# Patient Record
Sex: Male | Born: 1950 | Race: White | Hispanic: No | Smoking: Current every day smoker
Health system: Southern US, Community
[De-identification: ages and names within clinical notes are randomized; demographics above are authoritative.]

## PROBLEM LIST (undated history)

## (undated) DIAGNOSIS — I10 Essential (primary) hypertension: Secondary | ICD-10-CM

## (undated) DIAGNOSIS — E1140 Type 2 diabetes mellitus with diabetic neuropathy, unspecified: Secondary | ICD-10-CM

## (undated) DIAGNOSIS — D649 Anemia, unspecified: Secondary | ICD-10-CM

## (undated) DIAGNOSIS — I472 Ventricular tachycardia: Secondary | ICD-10-CM

## (undated) DIAGNOSIS — E119 Type 2 diabetes mellitus without complications: Secondary | ICD-10-CM

## (undated) DIAGNOSIS — I2510 Atherosclerotic heart disease of native coronary artery without angina pectoris: Secondary | ICD-10-CM

## (undated) DIAGNOSIS — I255 Ischemic cardiomyopathy: Secondary | ICD-10-CM

## (undated) DIAGNOSIS — M545 Low back pain, unspecified: Secondary | ICD-10-CM

## (undated) DIAGNOSIS — I5022 Chronic systolic (congestive) heart failure: Secondary | ICD-10-CM

## (undated) DIAGNOSIS — N529 Male erectile dysfunction, unspecified: Secondary | ICD-10-CM

## (undated) DIAGNOSIS — E7800 Pure hypercholesterolemia, unspecified: Secondary | ICD-10-CM

## (undated) DIAGNOSIS — M109 Gout, unspecified: Secondary | ICD-10-CM

## (undated) DIAGNOSIS — I4729 Other ventricular tachycardia: Secondary | ICD-10-CM

## (undated) DIAGNOSIS — I219 Acute myocardial infarction, unspecified: Secondary | ICD-10-CM

## (undated) HISTORY — PX: CARDIAC DEFIBRILLATOR PLACEMENT: SHX171

## (undated) HISTORY — DX: Male erectile dysfunction, unspecified: N52.9

## (undated) HISTORY — DX: Anemia, unspecified: D64.9

## (undated) HISTORY — DX: Essential (primary) hypertension: I10

## (undated) HISTORY — DX: Low back pain, unspecified: M54.50

## (undated) HISTORY — DX: Pure hypercholesterolemia, unspecified: E78.00

## (undated) HISTORY — DX: Gout, unspecified: M10.9

## (undated) HISTORY — DX: Acute myocardial infarction, unspecified: I21.9

## (undated) HISTORY — DX: Type 2 diabetes mellitus without complications: E11.9

## (undated) HISTORY — DX: Type 2 diabetes mellitus with diabetic neuropathy, unspecified: E11.40

## (undated) HISTORY — DX: Atherosclerotic heart disease of native coronary artery without angina pectoris: I25.10

## (undated) HISTORY — DX: Low back pain: M54.5

## (undated) HISTORY — PX: CORONARY ANGIOPLASTY WITH STENT PLACEMENT: SHX49

---

## 2003-06-24 ENCOUNTER — Inpatient Hospital Stay (HOSPITAL_COMMUNITY): Admission: AD | Admit: 2003-06-24 | Discharge: 2003-06-26 | Payer: Self-pay | Admitting: Cardiology

## 2003-06-24 ENCOUNTER — Encounter: Payer: Self-pay | Admitting: Cardiology

## 2005-05-12 ENCOUNTER — Inpatient Hospital Stay (HOSPITAL_COMMUNITY): Admission: EM | Admit: 2005-05-12 | Discharge: 2005-05-17 | Payer: Self-pay | Admitting: Emergency Medicine

## 2005-05-12 ENCOUNTER — Ambulatory Visit: Payer: Self-pay | Admitting: Internal Medicine

## 2005-05-12 DIAGNOSIS — I2510 Atherosclerotic heart disease of native coronary artery without angina pectoris: Secondary | ICD-10-CM | POA: Insufficient documentation

## 2005-05-14 ENCOUNTER — Ambulatory Visit: Payer: Self-pay | Admitting: Cardiology

## 2005-05-25 ENCOUNTER — Ambulatory Visit: Payer: Self-pay | Admitting: Cardiology

## 2005-06-21 ENCOUNTER — Encounter (HOSPITAL_COMMUNITY): Admission: RE | Admit: 2005-06-21 | Discharge: 2005-07-21 | Payer: Self-pay | Admitting: Orthopedic Surgery

## 2005-08-24 ENCOUNTER — Ambulatory Visit: Payer: Self-pay | Admitting: Cardiology

## 2006-02-23 ENCOUNTER — Ambulatory Visit: Payer: Self-pay | Admitting: Cardiology

## 2006-03-02 ENCOUNTER — Ambulatory Visit: Payer: Self-pay | Admitting: Cardiology

## 2006-03-22 ENCOUNTER — Ambulatory Visit: Payer: Self-pay | Admitting: Cardiology

## 2006-03-23 ENCOUNTER — Inpatient Hospital Stay (HOSPITAL_COMMUNITY): Admission: EM | Admit: 2006-03-23 | Discharge: 2006-03-25 | Payer: Self-pay | Admitting: Emergency Medicine

## 2006-03-23 ENCOUNTER — Ambulatory Visit: Payer: Self-pay | Admitting: Cardiology

## 2006-04-10 ENCOUNTER — Ambulatory Visit: Payer: Self-pay | Admitting: Cardiology

## 2006-04-17 ENCOUNTER — Ambulatory Visit: Payer: Self-pay

## 2006-04-27 ENCOUNTER — Ambulatory Visit: Payer: Self-pay | Admitting: Internal Medicine

## 2006-05-02 ENCOUNTER — Ambulatory Visit: Payer: Self-pay | Admitting: Cardiology

## 2006-05-12 ENCOUNTER — Ambulatory Visit: Payer: Self-pay | Admitting: Cardiology

## 2006-05-31 ENCOUNTER — Ambulatory Visit: Payer: Self-pay | Admitting: Cardiology

## 2006-06-05 ENCOUNTER — Encounter (HOSPITAL_COMMUNITY): Admission: RE | Admit: 2006-06-05 | Discharge: 2006-07-05 | Payer: Self-pay | Admitting: Internal Medicine

## 2006-06-09 ENCOUNTER — Ambulatory Visit: Payer: Self-pay

## 2006-06-14 ENCOUNTER — Ambulatory Visit: Payer: Self-pay | Admitting: Cardiology

## 2006-06-20 ENCOUNTER — Ambulatory Visit: Payer: Self-pay | Admitting: Internal Medicine

## 2006-07-07 ENCOUNTER — Encounter (HOSPITAL_COMMUNITY): Admission: RE | Admit: 2006-07-07 | Discharge: 2006-07-29 | Payer: Self-pay | Admitting: Internal Medicine

## 2006-07-26 ENCOUNTER — Ambulatory Visit: Payer: Self-pay | Admitting: Cardiology

## 2006-07-31 ENCOUNTER — Encounter (HOSPITAL_COMMUNITY): Admission: RE | Admit: 2006-07-31 | Discharge: 2006-08-30 | Payer: Self-pay | Admitting: Internal Medicine

## 2006-08-17 ENCOUNTER — Ambulatory Visit: Payer: Self-pay | Admitting: Cardiology

## 2007-06-05 ENCOUNTER — Ambulatory Visit: Payer: Self-pay | Admitting: Internal Medicine

## 2007-08-09 ENCOUNTER — Ambulatory Visit: Payer: Self-pay | Admitting: Internal Medicine

## 2008-02-13 ENCOUNTER — Ambulatory Visit (HOSPITAL_COMMUNITY)
Admission: RE | Admit: 2008-02-13 | Discharge: 2008-02-13 | Payer: Self-pay | Admitting: Physical Medicine and Rehabilitation

## 2008-02-28 ENCOUNTER — Ambulatory Visit: Payer: Self-pay | Admitting: Cardiology

## 2008-03-06 ENCOUNTER — Ambulatory Visit: Payer: Self-pay

## 2008-06-20 ENCOUNTER — Ambulatory Visit: Payer: Self-pay | Admitting: Internal Medicine

## 2008-10-13 ENCOUNTER — Encounter (HOSPITAL_COMMUNITY): Admission: RE | Admit: 2008-10-13 | Discharge: 2008-10-30 | Payer: Self-pay | Admitting: Neurology

## 2008-11-03 ENCOUNTER — Encounter (HOSPITAL_COMMUNITY): Admission: RE | Admit: 2008-11-03 | Discharge: 2008-12-03 | Payer: Self-pay | Admitting: Neurology

## 2008-11-03 DIAGNOSIS — I472 Ventricular tachycardia: Secondary | ICD-10-CM

## 2008-11-03 DIAGNOSIS — I10 Essential (primary) hypertension: Secondary | ICD-10-CM | POA: Insufficient documentation

## 2008-11-03 DIAGNOSIS — F17200 Nicotine dependence, unspecified, uncomplicated: Secondary | ICD-10-CM

## 2008-11-03 DIAGNOSIS — J449 Chronic obstructive pulmonary disease, unspecified: Secondary | ICD-10-CM

## 2008-11-03 DIAGNOSIS — F528 Other sexual dysfunction not due to a substance or known physiological condition: Secondary | ICD-10-CM | POA: Insufficient documentation

## 2008-11-03 DIAGNOSIS — J4489 Other specified chronic obstructive pulmonary disease: Secondary | ICD-10-CM | POA: Insufficient documentation

## 2008-11-03 DIAGNOSIS — E7800 Pure hypercholesterolemia, unspecified: Secondary | ICD-10-CM

## 2008-11-03 DIAGNOSIS — D539 Nutritional anemia, unspecified: Secondary | ICD-10-CM | POA: Insufficient documentation

## 2008-11-03 DIAGNOSIS — I255 Ischemic cardiomyopathy: Secondary | ICD-10-CM

## 2008-11-03 DIAGNOSIS — E119 Type 2 diabetes mellitus without complications: Secondary | ICD-10-CM | POA: Insufficient documentation

## 2008-12-03 ENCOUNTER — Encounter: Payer: Self-pay | Admitting: Internal Medicine

## 2009-02-26 ENCOUNTER — Ambulatory Visit: Payer: Self-pay | Admitting: Cardiology

## 2009-02-26 DIAGNOSIS — R072 Precordial pain: Secondary | ICD-10-CM | POA: Insufficient documentation

## 2009-06-30 ENCOUNTER — Encounter (INDEPENDENT_AMBULATORY_CARE_PROVIDER_SITE_OTHER): Payer: Self-pay | Admitting: *Deleted

## 2009-07-30 ENCOUNTER — Encounter (INDEPENDENT_AMBULATORY_CARE_PROVIDER_SITE_OTHER): Payer: Self-pay | Admitting: *Deleted

## 2009-09-14 ENCOUNTER — Encounter: Payer: Self-pay | Admitting: Cardiology

## 2009-10-07 ENCOUNTER — Encounter: Payer: Self-pay | Admitting: Physician Assistant

## 2009-10-07 ENCOUNTER — Ambulatory Visit: Payer: Self-pay | Admitting: Cardiology

## 2009-10-07 DIAGNOSIS — R079 Chest pain, unspecified: Secondary | ICD-10-CM

## 2009-10-13 ENCOUNTER — Telehealth (INDEPENDENT_AMBULATORY_CARE_PROVIDER_SITE_OTHER): Payer: Self-pay

## 2009-10-14 ENCOUNTER — Ambulatory Visit: Payer: Self-pay

## 2009-10-14 ENCOUNTER — Ambulatory Visit: Payer: Self-pay | Admitting: Internal Medicine

## 2009-10-14 ENCOUNTER — Encounter (HOSPITAL_COMMUNITY): Admission: RE | Admit: 2009-10-14 | Discharge: 2009-10-29 | Payer: Self-pay | Admitting: Cardiology

## 2009-10-14 ENCOUNTER — Encounter: Payer: Self-pay | Admitting: Cardiology

## 2009-10-22 ENCOUNTER — Ambulatory Visit: Payer: Self-pay | Admitting: Cardiology

## 2009-12-23 ENCOUNTER — Telehealth: Payer: Self-pay | Admitting: Cardiology

## 2010-01-12 ENCOUNTER — Ambulatory Visit: Payer: Self-pay | Admitting: Internal Medicine

## 2010-01-12 DIAGNOSIS — Z95810 Presence of automatic (implantable) cardiac defibrillator: Secondary | ICD-10-CM

## 2010-10-06 ENCOUNTER — Encounter: Payer: Self-pay | Admitting: Cardiology

## 2010-10-26 ENCOUNTER — Ambulatory Visit: Payer: Self-pay | Admitting: Cardiology

## 2010-10-26 ENCOUNTER — Inpatient Hospital Stay (HOSPITAL_COMMUNITY)
Admission: RE | Admit: 2010-10-26 | Discharge: 2010-10-27 | Payer: Self-pay | Source: Home / Self Care | Attending: Physician Assistant | Admitting: Physician Assistant

## 2010-11-21 ENCOUNTER — Encounter: Payer: Self-pay | Admitting: Cardiology

## 2010-11-30 NOTE — Letter (Signed)
Summary: [facility]  [facility]   Imported By: Marylou Mccoy 11/02/2009 13:38:01  _____________________________________________________________________  External Attachment:    Type:   Image     Comment:   External Document

## 2010-11-30 NOTE — Assessment & Plan Note (Signed)
Summary: pc2/ appt is 4:00 p.m.  gd   Visit Type:  Follow-up Primary Provider:  Fara Chute, MD   History of Present Illness: [name] [name] returns today for followup.  He is a very pleasant middle-aged male with an ischemic cardiomyopathy, ventricular tachycardia, congestive heart failure, diabetes, and hypertension with ongoing tobacco abuse.  He returns today for followup. He complains of fatigue and weakness.  He continues to work a regular week job. He also c/o discomfort at his ICD site when he drives. No ICD shocks.  Current Medications (verified): 1)  Zetia 10 Mg Tabs (Ezetimibe) .... Take One Tablet By Mouth Daily. 2)  Carvedilol 25 Mg Tabs (Carvedilol) .... Take Two Tablets By Mouth Twice A Day 3)  Ramipril 10 Mg Caps (Ramipril) .... Take One Capsule By Mouth Daily 4)  Hydrochlorothiazide 25 Mg Tabs (Hydrochlorothiazide) .... Take One Tablet By Mouth Daily. 5)  Spiriva Handihaler 18 Mcg Caps (Tiotropium Bromide Monohydrate) .... Daily 6)  Advair Diskus 100-50 Mcg/dose Misc (Fluticasone-Salmeterol) .... Daily 7)  Actos 45 Mg Tabs (Pioglitazone Hcl) .Marland Kitchen.. 1 Tab Daily 8)  Omeprazole 20 Mg Tbec (Omeprazole) .... Daily 9)  Aspirin 81 Mg Tbec (Aspirin) .... Take One Tablet By Mouth Daily 10)  Lantus Solostar 100 Unit/ml Soln (Insulin Glargine) .... 30 Units 11)  Ferrous Sulfate 325 (65 Fe) Mg Tbec (Ferrous Sulfate) .... 2 Two Times A Day 12)  Neurontin 300 Mg Caps (Gabapentin) .Marland Kitchen.. 1 Tab Two Times A Day 13)  Percocet 10-325 Mg Tabs (Oxycodone-Acetaminophen) .... Prn 14)  Phenergan 25 Mg/ml Soln (Promethazine Hcl) .... As Needed 15)  Nitroglycerin 0.4 Mg Subl (Nitroglycerin) .... One Tablet Under Tongue Every 5 Minutes As Needed For Chest Pain---May Repeat Times Three 16)  Glipizide 10 Mg Tabs (Glipizide) .Marland Kitchen.. 1 Once Daily 17)  Metformin Hcl 500 Mg Tabs (Metformin Hcl) .... Take Two Times A Day 18)  Morphine Sulfate 15 Mg Tabs (Morphine Sulfate) .... Every 12 Hrs 19)  Valium 5 Mg Tabs  (Diazepam) .... Take Two At Bedtime 20)  Amlodipine Besylate 5 Mg Tabs (Amlodipine Besylate) .... 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    Vital Signs:  Patient profile:   60 year old male Height:      69 inches Weight:      209 pounds BMI:     30.98 Pulse rate:   66 / minute BP sitting:   140 / 80  Vitals Entered By: Laurance Flatten CMA (January 12, 2010 10:55 AM) Comments Pt does not have a medication list. Nor does he know what medications he is on. Pt states that his meds have not changed since he  saw Dr. Lance Morin a month ago.   Physical Exam  General:  Well-developed well-nourished in no acute distress.  Skin is warm and dry.  HEENT is normal.  Neck is supple. No thyromegaly.  Chest is clear to auscultation with normal expansion.  Well healed ICD incision. Cardiovascular exam is regular rate and rhythm.  Abdominal exam  nontender or distended. No masses palpated. Extremities show no edema. neuro grossly intact     ICD Specifications Following MD:  Lewayne Bunting, MD     Referring MD:  CRENSHAW ICD Vendor:  Medtronic     ICD Model Number:  7232     ICD Serial Number:  [id number] H ICD DOI:  03/24/2006     ICD Implanting MD:  Lewayne Bunting, MD  Lead 1:    Location: RV     DOI: 03/24/2006     Model #: 5621     Serial #: [id number]     Status:  active  Indications::  ICM   ICD Follow Up Remote Check?  No Battery Voltage:  3.10 V     Charge Time:  8.40 seconds     Underlying rhythm:  SR ICD Dependent:  No       ICD Device Measurements Right Ventricle:  Amplitude: 4.5 mV, Impedance: 496 ohms, Threshold: 2.0 V at 0.9 msec Shock Impedance: 55/74 ohms   Episodes Shock:  0     ATP:  0     Nonsustained:  0     Ventricular Pacing:  <0.1%  Brady Parameters Mode VVI     Lower Rate Limit:  40      Tachy Zones VF:  200     VT:  273     VT1:  171     Next Cardiology Appt Due:  03/31/2010 Tech Comments:  Normal device function. No changes made today.  [phone] lead stable.  Pt has updated magnet and letter.  ROV 3 months clinic. Gypsy Balsam RN BSN  January 12, 2010 10:55 AM  MD Comments:  Agree with above.  Impression & Recommendations:  Problem # 1:  VENTRICULAR TACHYCARDIA (ICD-427.1) No recent ICD therapies.  Will follow and continue current meds. His updated medication list for this problem includes:    Carvedilol 25 Mg Tabs (Carvedilol) .Marland Kitchen... Take two tablets by mouth twice a day    Ramipril 10 Mg Caps (Ramipril) .Marland Kitchen... Take one capsule by mouth daily    Aspirin 81 Mg Tbec (Aspirin) .Marland Kitchen... Take one tablet by mouth daily    Nitroglycerin 0.4 Mg Subl (Nitroglycerin) ..... One tablet under tongue every 5 minutes as needed for chest pain---may repeat times three    Amlodipine Besylate 5 Mg Tabs (Amlodipine besylate) .Marland Kitchen... Take one tablet by mouth daily  Problem # 2:  CARDIOMYOPATHY, ISCHEMIC (ICD-414.8) He denies anginal symptoms. Continue current meds. His updated medication list for this problem includes:    Carvedilol 25 Mg Tabs (Carvedilol) .Marland Kitchen... Take two tablets by mouth twice a day    Ramipril 10 Mg Caps (Ramipril) .Marland Kitchen... Take one capsule by mouth daily    Hydrochlorothiazide 25 Mg Tabs (Hydrochlorothiazide) .Marland Kitchen... Take one tablet by mouth daily.    Aspirin 81 Mg Tbec (Aspirin) .Marland Kitchen... Take one tablet by mouth daily    Nitroglycerin  0.4 Mg Subl (Nitroglycerin) ..... One tablet under tongue every 5 minutes as needed for chest pain---may repeat times three    Amlodipine Besylate 5 Mg Tabs (Amlodipine besylate) .Marland Kitchen... Take one tablet by mouth daily  Problem # 3:  AUTOMATIC IMPLANTABLE CARDIAC DEFIBRILLATOR SITU (ICD-V45.02) His device is working normally.  Will recheck in several months.

## 2010-11-30 NOTE — Progress Notes (Signed)
Summary: pt needs 90day supply  Phone Note Refill Request Call back at Home Phone [phone] Message from:  [facility] [address] [facility] [phone]  Refills Requested: Medication #1:  AMLODIPINE BESYLATE 5 MG TABS Take one tablet by mouth daily. pt needs a 90day supply Rx written instead of 30day  Initial call taken by: Omer Jack,  December 23, 2009 1:33 PM    Prescriptions: RAMIPRIL 10 MG CAPS (RAMIPRIL) Take one capsule by mouth daily  #90 x 3   Entered by:   Kem Parkinson   Authorized by:   Ferman Hamming, MD, [facility]   Signed by:   Kem Parkinson on 12/23/2009   Method used:   [facility] to        [facility] [address]       [address] [place]  [address]       Ph: [phone]       Fax: [phone]   RxID:   [id number]

## 2010-12-02 NOTE — Letter (Signed)
Summary: [facility] Office Visit Note   [facility] Office Visit Note   Imported By: Roderic Ovens 10/26/2010 16:06:25  _____________________________________________________________________  External Attachment:    Type:   Image     Comment:   External Document

## 2011-01-10 LAB — DIFFERENTIAL
Basophils Absolute: 0 10*3/uL (ref 0.0–0.1)
Basophils Relative: 0 % (ref 0–1)
Eosinophils Absolute: 0.2 10*3/uL (ref 0.0–0.7)
Neutrophils Relative %: 59 % (ref 43–77)

## 2011-01-10 LAB — COMPREHENSIVE METABOLIC PANEL
ALT: 26 U/L (ref 0–53)
Alkaline Phosphatase: 51 U/L (ref 39–117)
CO2: 28 mEq/L (ref 19–32)
GFR calc non Af Amer: >60 mL/min (ref >60)
Glucose, Bld: 78 mg/dL (ref 70–99)
Potassium: 4.6 mEq/L (ref 3.5–5.1)
Sodium: 140 mEq/L (ref 135–145)
Total Bilirubin: 0.3 mg/dL (ref 0.3–1.2)

## 2011-01-10 LAB — CBC
HCT: 38.7 % — ABNORMAL LOW (ref 39.0–52.0)
HCT: 42.5 % (ref 39.0–52.0)
Hemoglobin: 14 g/dL (ref 13.0–17.0)
MCHC: 32.3 g/dL (ref 30.0–36.0)
MCV: 86.4 fL (ref 78.0–100.0)
Platelets: 197 10*3/uL (ref 150–400)
RDW: 13.4 % (ref 11.5–15.5)
WBC: 7.2 10*3/uL (ref 4.0–10.5)
WBC: 8.1 10*3/uL (ref 4.0–10.5)

## 2011-01-10 LAB — URINALYSIS, ROUTINE W REFLEX MICROSCOPIC
Nitrite: NEGATIVE
Specific Gravity, Urine: 1.014 (ref 1.005–1.030)
Urobilinogen, UA: 0.2 mg/dL (ref 0.0–1.0)
pH: 5 (ref 5.0–8.0)

## 2011-01-10 LAB — PROTIME-INR: INR: 0.96 (ref 0.00–1.49)

## 2011-01-10 LAB — GLUCOSE, CAPILLARY
Glucose-Capillary: 100 mg/dL — ABNORMAL HIGH (ref 70–99)
Glucose-Capillary: 124 mg/dL — ABNORMAL HIGH (ref 70–99)
Glucose-Capillary: 134 mg/dL — ABNORMAL HIGH (ref 70–99)
Glucose-Capillary: 155 mg/dL — ABNORMAL HIGH (ref 70–99)
Glucose-Capillary: 162 mg/dL — ABNORMAL HIGH (ref 70–99)
Glucose-Capillary: 190 mg/dL — ABNORMAL HIGH (ref 70–99)
Glucose-Capillary: 87 mg/dL (ref 70–99)

## 2011-01-10 LAB — BASIC METABOLIC PANEL
BUN: 8 mg/dL (ref 6–23)
Calcium: 8.9 mg/dL (ref 8.4–10.5)
GFR calc non Af Amer: >60 mL/min (ref >60)
Glucose, Bld: 127 mg/dL — ABNORMAL HIGH (ref 70–99)
Potassium: 4.3 mEq/L (ref 3.5–5.1)
Sodium: 137 mEq/L (ref 135–145)

## 2011-01-10 LAB — CROSSMATCH: Antibody Screen: NEGATIVE

## 2011-01-10 LAB — SURGICAL PCR SCREEN: MRSA, PCR: NEGATIVE

## 2011-01-10 LAB — APTT: aPTT: 31 seconds (ref 24–37)

## 2011-01-20 ENCOUNTER — Encounter (INDEPENDENT_AMBULATORY_CARE_PROVIDER_SITE_OTHER): Payer: Self-pay | Admitting: *Deleted

## 2011-01-27 NOTE — Letter (Signed)
Summary: Appointment - Reminder 2  [facility], Main Office  [address]   [place], [place] [address]   Phone: [phone]  Fax: [phone]     January 20, 2011 MRN: [id number]   [name] [name] [address] [place], [place]  [address]   Dear [name],  Our records indicate that it is past time to schedule a follow-up appointment.  Dr. Ladona Ridgel reccomended that you follow up with [place] in March. It is very important that we reach you to schedule this appointment. We look forward to participating in your health care needs. Please contact us at the number listed above at your earliest convenience to schedule your appointment.  If you are unable to make an appointment at this time, give [place] a call so we can update our records.     Sincerely,   Glass blower/designer

## 2011-03-15 NOTE — Assessment & Plan Note (Signed)
[facility]                         ELECTROPHYSIOLOGY OFFICE NOTE   NAME:[name], [name]                      MRN:          [id number]  DATE:06/20/2008                            DOB:          [birth year]    HISTORY OF PRESENT ILLNESS:  [name] [name] returns today for followup.  He  is a very pleasant middle-aged male with an ischemic cardiomyopathy,  ventricular tachycardia, congestive heart failure, diabetes, and  hypertension with ongoing tobacco abuse.  He returns today for followup.  He complains of fatigue and weakness.  He continues to work a regular  week job.   MEDICATIONS:  1. Advicor 100/50 DSC b.i.d.  2. Actos 45 a day.  3. Omeprazole 20 a day.  4. Aspirin 81 a day.  5. Insulin as directed.  6. Zetia 10 a day.  7. Altace 10 a day.  8. Coreg 25 twice daily.  9. Glucotrol 10 twice daily.  10.Plavix 75 a day.  11.HCTZ 25 a day.   PHYSICAL EXAMINATION:  GENERAL:  He is a pleasant, middle-aged obese,  mildly obese man, in no acute distress.  VITAL SIGNS:  Blood pressure was 123/72, pulse 56 and regular,  respirations 18, weight was 213 pounds.  NECK:  Revealed no jugular distention.  LUNGS:  Clear bilaterally to auscultation.  No wheezes, rales, or  rhonchi are present.  CARDIOVASCULAR:  Revealed a regular rate and rhythm.  Normal S1 and S2.  EXTREMITIES:  Demonstrate no edema.  ABDOMEN:  Obese, nontender, nondistended.  There is no organomegaly.   Interrogation of his defibrillator demonstrates a Medtronic Maximo, R  waves were 4, the impedance 400, the threshold 3.5 at 0.4.  Battery  voltage was 3.16 volts.   IMPRESSION:  1. Ischemic cardiomyopathy.  2. Congestive heart failure.  3. Ventricular tachycardia.  4. Status post implantable cardioverter-defibrillator insertion.  5. Ongoing tobacco use.   DISCUSSION:  [name] [name] is stable.  His defibrillator is working  satisfactorily.  There is no evidence that he has a broken 6949  lead.  I  have encouraged him to continue his working as  well as he can to stop smoking.  Continue his medicines to maintain a  low-salt diet.  We will see him back in 1-year for ICD followup.     [name] [name]. Ladona Ridgel, MD  Electronically Signed    GWT/MedQ  DD: 06/20/2008  DT: 06/21/2008  Job #: [id number]

## 2011-03-15 NOTE — Assessment & Plan Note (Signed)
[facility]                         ELECTROPHYSIOLOGY OFFICE NOTE   NAME:[name] [name], [name] [name]                      MRN:          [id number]  DATE:06/05/2007                            DOB:          13-Oct-1951    Mr. Minkoff returns today for followup. He is a very pleasant, middle-  aged man with an ischemic cardiomyopathy, nonsustained ventricular  tachycardia status post ICD insertion. He has multiple stents implanted.  He returns today for followup. He notes that when he exerts himself  sometimes he will have substernal chest tightness which was better with  rest. It has not progressed in any significant amount, however.  Otherwise, he had no specific complaints today.   On physical exam, he is a pleasant, well-appearing, young man in no  acute distress. The blood pressure was 137/78, pulse 69 and regular, the  respirations were 18, the weight was 203 pounds.  The neck revealed no jugular venous distention.  LUNGS:  Were clear bilaterally to auscultation. No wheezes, rales or  rhonchi were present.  CARDIOVASCULAR EXAM:  Revealed a regular rate and rhythm with a normal  S1 and S2.  EXTREMITIES:  No edema.   MEDICATIONS:  Include:  1. Zetia 10 mg daily.  2. Aciphex 20 a day.  3. Coreg 25 twice a day.  4. Altace 10 a day.  5. Glucotrol 10 b.i.d.  6. Plavix 75 a day.  7. Actos 30 a day.  8. Aspirin 325 a day.  9. Multivitamins.   Interrogation of his defibrillator demonstrates a Medtronic Maximo with  R waves of 3.3. The impedance was 440 ohms with a threshold of 3.5 volts  at 0.3 milliseconds. Battery voltage was 3.19 volts. Today, we turned  his outputs up to 4 to help backup the pace. There were no short  countered RR intervals noted.   IMPRESSION:  1. Ischemic cardiomyopathy.  2. Congestive heart failure.  3. Status post ICD insertion.   DISCUSSION:  Overall, Mr. Hijazi is stable. He is questioned whether now  two years out from his  stents he needs to continue on his Plavix. I will  discuss this with Dr. Riley Kill. For now, he will continue with all of his  current medications.     Doylene Canning. Ladona Ridgel, MD  Electronically Signed    GWT/MedQ  DD: 06/05/2007  DT: 06/05/2007  Job #: [id number]   cc:   Fara Chute

## 2011-03-15 NOTE — Assessment & Plan Note (Signed)
[facility]                            CARDIOLOGY OFFICE NOTE   NAME:[name] [name], ABDALLA NARAMORE                      MRN:          [id number]  DATE:02/28/2008                            DOB:          02-09-1951    [name] is a 60 year old gentleman who has a history of coronary  artery disease, ICD secondary to ventricular tachycardia and mildly  reduced LV function.  Since I last saw him, he continues to have  occasional chest pain; this predominantly occurs exertion.  It has been  a chronic issue and is unchanged in severity or frequency.  He also  continues to have fatigue, which is unchanged.  There is no dyspnea or  pedal edema.  He is discussing having injections in his back for back  pain and they would like to discontinue his Plavix and aspirin.   MEDICATIONS:  1. Advair.  2. Actos.  3. Omeprazole.  4. Zetia 10 mg p.o. daily.  5. Coreg 25 mg p.o. b.i.d.  6. Altace 10 mg p.o. daily.  7. Glucotrol 10 mg p.o. b.i.d.  8. Plavix 75 mg p.o. daily.  9. Aspirin 325 mg p.o. daily.  10.Iron.  11.Hydrochlorothiazide 25 mg p.o. daily.  12.Spiriva.   PHYSICAL EXAMINATION:  His physical exam today shows a blood pressure of  152/75 and his pulse is 56.  He weighs 207 pounds.  HEENT:  Normal.  NECK:  Supple with no bruits.  CHEST:  Clear.  CARDIOVASCULAR:  Regular rhythm.  ABDOMEN:  Exam shows no tenderness and there is no bruit.  EXTREMITIES:  Show no edema.   ELECTROCARDIOGRAM:  Shows a sinus rhythm at a rate of 57.  There are no  ST changes noted.   DIAGNOSES:  1. Coronary artery disease with history of drug-eluting stents to the      right coronary artery and left anterior descending -- [name] [name]      was having some vague chest pain.  He has had problems with this in      the past and it is unchanged.  His last catheterization in May 2007      showed nonobstructive disease.  We will schedule him for an      adenosine Myoview.  If it shows no  ischemia, then we will continue      with medical therapy.  We will continue his aspirin, Plavix, Coreg,      ACE inhibitor and Zetia.  He has not tolerated statins in the past.  2. Chest pain -- as per #1.  3. History of mildly reduced left ventricular function.  4. History of ventricular cardia, status post implantable cardioverter-      defibrillator -- he will follow up Dr. Ladona Ridgel concerning this      issue.  5. Hypertension -- he will continue on his present blood pressure      medications.  6. Hyperlipidemia -- he will continue on Zetia and we will have his      most recent lipids forwarded to [place] with Dr. Dian Situ office.  I      will  also have his most recent BMET forwarded to [place] as well.  He      has not tolerated statins in the past.  7. Diabetes mellitus -- management per Dr. Neita Carp.  8. Chronic obstructive pulmonary disease.  9. History of microcytic anemia -- this has been evaluated by Dr.      Neita Carp previously.  10.Tobacco abuse -- we discussed the importance of discontinuing this.  11.Statin intolerance.   I will see him back in 9 months if his Myoview looks unchanged.  Note:  We discussed the injection of steroids into his back.  I think it would  be reasonable for him to discontinue his Plavix prior to this procedure,  but I would like for him to continue on a baby aspirin if possible; if  not, we need to resume these immediately after the procedure.  He can  decrease his aspirin to 81 mg long-term.     Madolyn Frieze Jens Som, MD, [facility]  Electronically Signed    BSC/MedQ  DD: 02/28/2008  DT: 02/28/2008  Job #: [id number]   cc:   Fara Chute

## 2011-03-18 NOTE — H&P (Signed)
NAMEBURNELL, HURTA NO.:  [id number]   MEDICAL RECORD NO.:  [id number]          PATIENT TYPE:  INP   LOCATION:  1826                         FACILITY:  [facility]   PHYSICIAN:  Rollene Rotunda, M.D.   DATE OF BIRTH:  08-31-1951   DATE OF ADMISSION:  05/12/2005  DATE OF DISCHARGE:                                HISTORY & PHYSICAL   PRIMARY CARE PHYSICIAN:  Dr. Marcelino Duster   CARDIOLOGIST:  Dr. Simona Huh   CHIEF COMPLAINT:  Chest pain.   HISTORY OF PRESENT ILLNESS:  The patient is a 60 year old gentleman with a  previous catheterization with non-obstructive coronary disease in the past  (June 25, 2003 50% LAD stenosis, 50% PDA stenosis.  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  He had well-preserved ejection  fraction).  He has been managed with risk reduction, but has not  participated aggressively.  He has continued to smoke two to three packs of  cigarettes a day.  He also has history of diabetes and hypertension.   He has been having chest pain on and off for the last couple of days.  This  has been substernal and at rest.  This morning at 10:30 at home getting  ready to shower he developed substernal chest discomfort 10/10 with  diaphoresis and radiation down his left arm.  He called EMS squad.  They  arrived some time around 11:22 and noted 2 mm ST segment elevation in leads  2, 3, aVF with 1 mm ST segment depression 1 and aVL.  He was managed with  aspirin and transported directly to [facility] where he arrived  around noon.  He was also managed with heparin, one dose of morphine, and a  sublingual nitroglycerin.  We were called urgently to the emergency room  where he was assessed and taken directly to the catheterization laboratory.   PAST MEDICAL HISTORY:  1.  Diabetes mellitus (it is not clear whether he is on an oral agent or      not).  2.  Hypertension.  3.  Dyslipidemia.  4.  COPD.  5.  Erectile  dysfunction.  6.  Plantar fasciitis.   PAST SURGICAL HISTORY:  None.   ALLERGIES:  None (the patient has been intolerant of LIPITOR and LESCOL in  the past).   MEDICATIONS:  1.  Aspirin 81 mg daily.  2.  Albuterol.  3.  Altace 10 mg q.12h.  4.  Elavil 25 mg q.h.s.  5.  Aciphex 20 mg a day.  6.  Zetia 10 mg daily.  7.  Coreg 25 mg q.12h.   SOCIAL HISTORY:  The patient is married.  Smoking two to three packs of  cigarettes per day and has done so for 40 years.  He does not drink alcohol.  He works in a [employer] development [employer].  He has been under stress at home.   FAMILY HISTORY:  Contributory for his father dying in his 37s of a heart  problem.   REVIEW OF SYSTEMS:  As stated in the HPI (obtained per wife).  Decreased  hearing, nasal discharge, cough and wheezing, fatigue, leg cramps, erectile  dysfunction, gastroesophageal reflux.  Otherwise, negative for all other  systems.   PHYSICAL EXAMINATION:  GENERAL:  The patient is in obvious distress with  pain.  VITAL SIGNS:  Blood pressure 148/76, heart rate 58 and regular.  HEENT:  Eyelids unremarkable.  Pupils are equal, round, and reactive to  light.  Fundi not visualized.  Oral mucosa not visualized.  NECK:  No bruits.  No obvious thyromegaly.  No jugular veins assessed as he  is laying flat.  LYMPHATICS:  No obvious cervical, axillary, or inguinal adenopathy.  LUNGS:  Decreased breath sounds, but no wheezing or crackles.  BACK:  No obvious costovertebral angle tenderness.  CHEST:  Unremarkable.  HEART:  PMI not displaced or sustained.  S1, S2 within normal limits.  No  S3.  No S4.  No rubs.  ABDOMEN:  Flat.  Positive bowel sounds.  Normal in frequency and pitch.  No  bruits, rebound, guarding, midline pulsatile mass, hepatomegaly,  splenomegaly.  SKIN:  No rashes.  No nodules.  EXTREMITIES:  2+ pulses upper, 2+ femorals, 1+ dorsalis pedis and posterior  tibialis bilaterally.  No clubbing, cyanosis, edema.  NEUROLOGIC:  The  patient is oriented to person, place, and time.  Cranial  nerves are grossly intact.  Motor is grossly intact throughout.   EKG as above.   LABORATORIES:  Sodium 131, potassium 6.9, chloride 107, BUN 14.  Hemoglobin  15.3.  Glucose 383.  pH 7.28.   ASSESSMENT/PLAN:  1.  Coronary disease.  Patient is having an acute inferior myocardial      infarction.  He is taken urgently to the catheterization laboratory for      intervention per Dr. Riley Kill.  2.  Diabetes.  The patient appears to have low level of potentially      ketoacidosis.  This will need to be managed aggressively with      Glucommander.  3.  Dyslipidemia.  Will try to start him on Pravachol.  Will try to assess      his true intolerance of Statins after he has completed his urgent      intervention.  Will check a fasting lipid profile.  4.  Tobacco.  Will get a smoking consult.       JH/MEDQ  D:  05/12/2005  T:  05/12/2005  Job:  [id number]   cc:   Marcelino Duster  [address] [place]  [place] [address]  Fax: [phone]   Jonelle Sidle, M.D. [facility]

## 2011-03-18 NOTE — Discharge Summary (Signed)
NAME:  [name] [name], [name] [name]                         ACCOUNT NO.:  [id number]   MEDICAL RECORD NO.:  [id number]                   PATIENT TYPE:  INP   LOCATION:  3728                                 FACILITY:  [facility]   PHYSICIAN:  Learta Codding, M.D.                 DATE OF BIRTH:  Sep 06, 1951   DATE OF ADMISSION:  06/24/2003  DATE OF DISCHARGE:  06/26/2003                           DISCHARGE SUMMARY - REFERRING   PROCEDURE:  Coronary angiogram on June 25, 2003.   REASON FOR ADMISSION:  Mr. Hyle is a 60 year old male with no prior  history of coronary artery disease, but multiple cardiac risk factors,  recently referred to Dr. Lewayne Bunting and referred for elective coronary  angiography.  Please refer to dictated admission note for full details.   LABORATORY DATA:  Normal CBC.  Sodium 137, potassium 4.4, glucose 161, BUN  17, creatinine 1.3. INR 0.8.   Admission chest x-ray:  Mild bronchitic changes.   HOSPITAL COURSE:  The patient presented for elective coronary angiography,  performed by Dr. Vida Roller (see report for full details), revealing  noncritical coronary artery disease with a normal left ventricle.  Specifically, there was a 50% mid LAD disease with a 75% mid distal  anomalous circumflex (off RCA) with normal right coronary artery.  LV gram  revealed hyperdynamic LVF (70%), with no wall motion abnormalities.  There  was significant valvular abnormalities.  Distal aortogram was normal.   Dr. [name] recommended medical therapy and the patient was started on low  dose Toprol and Lipitor.  He will continue on Altace as previously directed.  Dr. [name] also recommended a followup exercise stress Cardiolite next week  to assess functional capacity.  We will perform this on low dose beta-  blocker.  The patient will also need a fasting lipid/liver profile prior to  his stress test.   DISCHARGE MEDICATIONS:  1. Coated aspirin 81 mg daily.  2. Altace 5 mg daily.  3.  Glucophage 500 mg t.i.d.  4. Toprol XL 25 mg daily.  5. Lipitor 20 mg daily.  6. Nitrostat 0.4 mg p.r.n.   ACTIVITY:  1. The patient is to refrain from returning to work until July 07, 2003.  2. Stop smoking tobacco.   DIET:  The patient is to maintain a low fat/cholesterol diet.   WOUND CARE:  Call the office if there is any swelling/bleeding of the groin.   FOLLOW UP:  1. The patient is instructed to have a fasting lipid/liver profile on     Monday, July 31, 2003, at 8:30 a.m. at the [facility].  2. The patient is scheduled for an exercise stress Cardiolite on Tuesday,     July 01, 2003, at 7:30 a.m. at [facility]  3. The patient will then follow up with Dr. Lewayne Bunting on Thursday,     July 03, 2003, at 11 a.m. at our clinic  in [place].   DISCHARGE DIAGNOSES:  1. Noncritical coronary artery disease.     a. Coronary angiogram on June 25, 2003.     b. Medical therapy recommended.  2. Multiple cardiac risk factors.     a. History of hypertension.     b. Dyslipidemia.     c. Type 2 diabetes mellitus.     d. Tobacco.     e. Family history.      Rande Brunt, P.A.-C  [facility]             Learta Codding, M.D.    ECS/MEDQ  D:  06/26/2003  T:  06/26/2003  Job:  [id number]   cc:   Clarene Critchley., M.D., [place], [place]

## 2011-03-18 NOTE — Assessment & Plan Note (Signed)
[facility]                              CARDIOLOGY OFFICE NOTE   NAME:[name] [name], [name] [name]                      MRN:          [id number]  DATE:05/31/2006                            DOB:          04-24-1951    [name] [name] is a 60 year old gentleman with a history of coronary disease who  has been previously followed by Dr. [name] and Dr. Graciela Husbands.  I do not have any  records available concerning his [place] evaluations.  He apparently had an  inferior myocardial infarction in July of 2006.  He had PCI of his right  coronary artery at that time and staged PCI of his LAD subsequently.  These  were with drug-eluding stents.  He was recently admitted to [facility] after he complained of presyncopal episodes, and then monitor  showed ventricular tachycardia.  The patient subsequently underwent cardiac  catheterization.  He was found to have a 30% left main, 30% LAD, and the  stents in the previous right coronary artery were patent as well.  There was  a 50-60% distal PLA/circumflex, not changed from previous cath, and his EF  was 45%.  It was felt that he was well to be revascularized.  The patient  subsequently had an ICD placed.  The patient apparently has been seeing Dr.  [name] since that time.  He does have some dyspnea on exertion and fatigue.  There is no orthopnea, PND or pedal edema.  He has also had continued  episodes of dizziness.  Dr. [name] apparently ordered an echocardiogram,  pulmonary function test and other studies, but I do not have those records  available.  The patient also saw Dr. Graciela Husbands recently in followup.  In that  note, apparently, there was a Myoview in April 2007 that apparently was  negative.  Dr. Graciela Husbands performed an exercise treadmill and his initial blood  pressure was 170/50 and at 1 minute was 143/56.  At 2 minutes, he was  165/62, and at 3 minutes, he was 131/64.  Finally, at 4 minutes, it was  135/57.  It was felt that he  was having dizzy spells with blood pressure  instability with exercise and possibly cough syncope.  There was no evidence  of ventricular arrhythmias.  Dr. [name] recently decreased his Coreg.  The  patient has not had chest pain or syncope.   MEDICATIONS:  Aspirin 325 mg p.o. daily, Plavix 75 mg p.o. daily, Altace 10  mg p.o. daily, AcipHex 20 mg p.o. daily, Glucotrol 10 mg p.o. b.i.d., Zetia  10 mg p.o. daily, Actos 30 mg p.o. daily, Cymbalta, Coreg 12.5 mg p.o.  b.i.d., and hydrochlorothiazide 25 mg p.o. daily.  He also takes Metanx.   PHYSICAL EXAMINATION:  VITAL SIGNS:  Blood pressure 128/80.  Pulse 67.  He  weighs 211 pounds.  NECK:  Supple.  CHEST:  Clear.  CARDIOVASCULAR:  Regular rhythm.  EXTREMITIES:  Show no edema.   DIAGNOSES:  1.  Coronary artery disease with prior drug-eluding stents to the right      coronary artery and left anterior descending (LAD).  2.  History of mildly reduced left ventricular function.  3.  History of ventricular tachycardia, status post implantable cardioverter-      defibrillator (ICD).  4.  Hypertension.  5.  Hyperlipidemia.  6.  Diabetes mellitus.  7.  Chronic obstructive pulmonary disease.  8.  Tobacco abuse.  9.  Microcytic anemia.  10. History of intolerance to Lipitor and Lescol.   PLAN:  [name] [name] is complaining of weakness and fatigue.  He also has  dyspnea.  I am at somewhat of a disadvantage as most of his records are in  [place], and we will obtain his clinic notes and also his echocardiogram,  pulmonary function tests, and other laboratories.  He is complaining of some  leg pain with ambulation and we will schedule him to have ABIs as well.  He  will also have lipids and liver, as well as a TSH and a BNP.  His blood  pressure appears to be reasonable today and I will not adjust his medicines  at this point.  I will see him back in two weeks to review the above  information.                              Madolyn Frieze Jens Som, MD,  [facility]    BSC/MedQ  DD:  05/31/2006  DT:  05/31/2006  Job #:  [id number]

## 2011-03-18 NOTE — Op Note (Signed)
[name] [name], [name] [name] NO.:  [id number]   MEDICAL RECORD NO.:  [id number]          PATIENT TYPE:  INP   LOCATION:  3739                         FACILITY:  [facility]   PHYSICIAN:  Doylene Canning. Ladona Ridgel, M.D.  DATE OF BIRTH:  [birth year]   DATE OF PROCEDURE:  03/24/2006  DATE OF DISCHARGE:                                 OPERATIVE REPORT   PROCEDURE PERFORMED:  Implantation of single chamber ICD.   INDICATION:  Ischemic heart disease, status post MI with LV dysfunction and  VT at a rate of 280 beats per minute.   The patient is a 60 year old man who had a history of recurrent syncope  spells and near syncope spells, who has a prior MI with diaphragmatic  infarction.  His EF is 45% with a posterobasal area of akinesis.  The  patient was admitted to hospital after he was found to have rapid wide QRS  tachycardia at 280 beats per minute.  Because this was associated with  symptoms and was documented for over 30 seconds, he is referred for ICD  implantation.   PROCEDURE:  After informed consent was obtained, the patient was taken  diagnostic EP lab in a fasting state.  After preparation and draping,  intravenous fentanyl and metaxalone were given for sedation.  30 mL  lidocaine was infiltrated into the left infraclavicular region.  A 7 cm  incision was carried out over this region.  Electrocautery utilized dissect  down to the fascial plane.  10 mL of contrast was injected into the left  upper extremity venous system, demonstrating a patent left subclavian vein.  Subsequently punctured and the Medtronic model [id number], 65-cm active fixation  defibrillation lead, serial number [id number], was advanced into the right  ventricle.  Mapping was carried out and in the final site on the RV septum  the R-waves measured 10 mV.  The pacing impedance with lead actively fixed  was 683 ohms, and the pacing threshold 0.6 volts at 0.5 milliseconds.  Next,  10 volt pacing was carried out,  demonstrating no diaphragmatic stimulation.  At this point, the lead was secured to the subpectoralis fascia with a  figure-of-eight silk suture.  Sewing sleeve was also secured with silk  suture.  Electrocautery was utilized to make subcutaneous pocket.  Kanamycin  irrigation was utilized to irrigate the pocket and electrocautery utilized  to assure hemostasis.  The Medtronic Maximo, model [id number], single chamber  defibrillator, serial number [id number], was connected to the defibrillation  lead and placed in the subcutaneous pocket.  Generator was secured with silk  suture.  Electrocautery was utilized to assure hemostasis.  Kanamycin  irrigation was utilized to irrigate the pocket and the incision then closed  with a layer of 2-0 Vicryl, followed by layer of 3-0 Vicryl, followed by  layer of 4-0 Vicryl.  Benzoin was painted on skin, Steri-Strips were  applied, pressure dressing was placed, and the patient was returned to his  room in satisfactory condition.   COMPLICATIONS:  There were no immediate procedure complications.  Results  demonstrate successful implantation of a Medtronic single chamber  defibrillator in a patient with ischemic cardiomyopathy and sustained  monomorphic VT.           ______________________________  Doylene Canning. Ladona Ridgel, M.D.     GWT/MEDQ  D:  03/24/2006  T:  03/26/2006  Job:  [id number]   cc:   Olga Millers, M.D. [facility]  [address]  [address]  [place]  [place] [address]   Jonelle Sidle, M.D. [facility]  [address]  [place]  [place] [address]   Learta Codding, M.D. [facility]  [address]  [address]  [place]  [place] [address]

## 2011-03-18 NOTE — H&P (Signed)
[name] [name], LIST NO.:  [id number]   MEDICAL RECORD NO.:  [id number]          PATIENT TYPE:  INP   LOCATION:  1850                         FACILITY:  [facility]   PHYSICIAN:  Olga Millers, M.D. LHCDATE OF BIRTH:  August 02, 1951   DATE OF ADMISSION:  03/23/2006  DATE OF DISCHARGE:                                HISTORY & PHYSICAL   PRIMARY CARDIOLOGIST:  Learta Codding, M.D.   PRIMARY CARE PHYSICIAN:  Fara Chute, M.D. in [place].   PATIENT PROFILE:  A 60 year old married white male with history of CAD who  presents with 8-week history of presyncope with documented ventricular  tachycardia on event monitor this morning.   PROBLEM LIST:  1.  Presyncope/VT.  2.  Coronary artery disease.      1.  On 05/12/2005 an inferior ST elevation MI with catheterization          revealing separate ostia of the LAD and circumflex.  The LAD had 60%          proximal; and then 8% mid with diffuse disease distally.  The          circumflex was small and diffusely diseased.  The RCA was 100%          proximally occluded.  The RCA was treated with a 3.5 x 12 mm Taxus          drug-eluting stent x2.      2.  On 05/16/2005 PCI and stenting of the mid LAD with a 2.5 x 12 mm          Taxus drug-eluting stent.      3.  In April 2007 Myoview study reportedly negative by patient.  3.  Hypertension.  4.  Hyperlipidemia.  5.  Type 2 diabetes mellitus.  6.  COPD.  7.  Tobacco abuse.  8.  Erectile dysfunction.  9.  Ischemic cardiomyopathy with EF of 45% in July 2006 with inferobasal      wall motion abnormalities noted on catheterization.   HISTORY OF PRESENT ILLNESS:  A 60 year old married white male with history  of CAD status post inferior MI with drug-eluting stents to the RCA (2) and  LAD (1) in July 2006.  Approximately 8 weeks ago he began to experience  episodes of presyncope (approximately 10 episodes over the past 2 months) in  the setting of either:  1) A 3/10 exertional  substernal chest pressure  associated with shortness of breath, diaphoresis, nausea, and flushing;  lasting less than 1 minute, and resolving with rest; or 2)  Fits of  coughing.  The patient was recently treated with antibiotics over the past 2  weeks for a diagnosis of pneumonia; and has been coughing quite frequently  with right-sided pleuritic-type chest pain while coughing.  He saw [name] [name] and [name] [name] in [place] on April 26 following an episode of presyncope  that occurred while he was in his garden.  At that time, he had a functional  study which he was told was normal.  Those records are not available at this  time.  He then was set up with an event monitor, which he picked up  yesterday.  He thinks sometime this morning he had episode of coughing; and  began to feel lightheaded; and, therefore, activated the event monitor.  On  analysis it was noted that he had significant runs of wide complex  ventricular tachycardia.  The patient was then contacted; and was advised to  present to the [facility] ED for further evaluation.  Currently he is pain  free.  He has not had any arrhythmias on the monitor.  He denies any PND,  orthopnea, true syncope, edema, or early satiety.   ALLERGIES:  He has been intolerant to Lipitor and Lescol in the past.   HOME MEDICATIONS:  1.  Aspirin 325 mg daily.  2.  Plavix 75 mg daily.  3.  Coreg 25 mg b.i.d.  4.  Altace 10 mg daily.  5.  Aciphex 20 mg daily.  6.  Glucotrol XL 10 mg b.i.d.  7.  Zetia 10 mg daily.  8.  Actos 30 mg daily.  9.  Cymbalta 60 mg daily.  10. Albuterol p.r.n.  11. Nitroglycerin p.r.n.  12. Imdur unknown dose daily.   FAMILY HISTORY:  Mother is age 30 alive and well.  Father died in his 12s of  CAD.  He has 1 brother with diabetes.   SOCIAL HISTORY:  [place].  He works  at a [employer].  He is married.  He has smoked for approximately 40  years; anywhere between 2 and 3  packs a day, currently smoking 1 pack a day.  Denies any alcohol or drugs.  He is active around the house, although has  been limited some in the past 2 months as outlined in the HPI.   REVIEW OF SYSTEMS:  Positive for chest pain, shortness breath, dyspnea  exertion, presyncope, cough, right-sided costochondritis, type 2 diabetes  mellitus.  All other systems reviewed are negative.   PHYSICAL EXAM:  VITAL SIGNS:  Temperature 98.1, heart rate 67, respirations  18, blood pressure 149/66 and pulse oximetry 98% on 2 liters/min.  GENERAL:  A pleasant white male in no acute distress.  Awake, alert, and  oriented x3.  NECK:  Normal __________, no bruits or JVD.  LUNGS:  Respirations are regular and labored with scattered rhonchi and  expiratory wheezing.  CARDIAC:  Regular S1-S2, no S3-S4, or murmurs.  ABDOMEN:  Round, soft, nontender, nondistended.  Bowel sounds present x4.  EXTREMITIES:  Warm and dry, pink.  No clubbing, cyanosis, or edema.  Dorsalis pedis and posterior tibial pulses are 1+ and equal bilaterally.  No  femoral bruits noted.   CHEST X-RAY:  Pending.   EKG:  Shows sinus rhythm with a rate of 62.  No acute ST-T changes.   LABS:  All of his lab work is pending.   ASSESSMENT AND PLAN:  1.  Presyncope/ventricular tachycardia.  The patient clearly has symptomatic      VT with presyncope and now documented on event monitor.  He has      previously documented normal LV function, and recent negative functional      study.  Notably the patient reports that he had a negative functional      study prior to MI in 2006 as well.  Plan to admit and cycle cardiac      markers.  Check CBC, BMET, magnesium, and thyroid function.  We will put  him on the schedule for cardiac catheterization, tomorrow, to further      rule out ischemia as the cause of VT.  We will obtain an LV-gram at time      to determine LV function.  We will also have him seen by     electrophysiology.  We have  already discussed this case with Dr. Graciela Husbands.      Patient will more than likely require an EP study and possibly an ICD      provided there are no obvious targets that might be causing ischemia on      catheterization.  Continue his beta blocker, ACE inhibitor, aspirin,      Plavix and Zetia.  He has tried Lipitor and Lescol in the past and      apparently did not tolerate these.  We will try a low dose of Zocor and      see how he does with that.  2.  CAD, as above, we will obtain cardiac catheterization.  The patient has      been having exertional chest discomfort despite negative functional      study.  Continue meds as above.  3.  Ischemic cardiomyopathy, EF previously documented at 45%.  To be      reevaluated with catheterization.  He appears euvolemic on exam.      Continue beta blocker and ACE inhibitor.  4.  Hypertension, slightly elevated currently.  We will continue to trend,      and otherwise continue beta blocker and Altace both of which are more or      less maxed out.  Could consider adding Norvasc.  5.  Hyperlipidemia.  Will add Zocor as above.  Continue Zetia.  6.  Type 2 diabetes mellitus.  Continue his home regimen and add sliding      scale insulin.  7.  Chronic obstructive pulmonary disease.  He does have a faint wheeze.  We      will have him taken his albuterol q.i.d. plus p.r.n. rather than p.r.n.      only.  8.  Ongoing tobacco abuse.  Smoking cessation strongly advised.  The patient      says that this is his only crutch.  We will have him seen by the smoking      cessation consult team.      Ok Anis, NP    ______________________________  Olga Millers, M.D. [facility]    CRB/MEDQ  D:  03/23/2006  T:  03/23/2006  Job:  [id number]

## 2011-03-18 NOTE — Cardiovascular Report (Signed)
NAMEDENIS, CARREON NO.:  [id number]   MEDICAL RECORD NO.:  [id number]           PATIENT TYPE:   LOCATION:                                 FACILITY:   PHYSICIAN:  Arturo Morton. Riley Kill, M.D. [facility] DATE OF BIRTH:   DATE OF PROCEDURE:  05/12/2005  DATE OF DISCHARGE:                              CARDIAC CATHETERIZATION   INDICATIONS:  The patient is a 60 year old gentleman who presents with an  acute inferior wall myocardial infarction.   PROCEDURE:  1.  Left heart catheterization.  2.  Selective coronary arteriography.  3.  Selective left ventriculography.  4.  Insertion of a temporary transvenous pacer in the RV apex.  5.  Percutaneous stenting of the right coronary artery using Taxus Express      stents.   DESCRIPTION OF PROCEDURE:  The patient was seen in the emergency room by Dr.  Antoine Poche.  The patient had DKA and potassium 6.7.  Sodium bicarbonate was  given.  A Glucomander was started.  Temporary transvenous pacer insertion  was performed from the right femoral vein.  Coronary arteriography and left  ventriculography was performed in standard fashion.  Following this,  percutaneous coronary intervention was performed.  Heparin and Integrilin  were given according to protocol and ACT checked and found to be  appropriate.  A JR-4 side-hole Wise guide was utilized with the 7-French  device.  High-torque floppy straight wire was used followed by a Luge.  Initial dilatations were done with Maverick #2 balloon.  The proximal total  occlusion was stented with a 12 x 3.5 Taxus drug-eluting stent and the  distal lesion and with a 12 x 3.5 Taxus drug-eluting stent. The vessels were  post dilated using a Quantum Maverick #4 balloon.  There were no major  complications with the procedure.   HEMODYNAMIC DATA:  1.  Central aortic pressure 137/80.  2.  Left ventricular pressure 137/25.  3.  No gradient pullback across aortic valve.   ANGIOGRAPHIC DATA:  1.  There  appeared to be separate ostia of the LAD and circumflex.  The LAD      demonstrates about a 60% segmental plaque in the proximal portion of the      vessel.  It does not appear to be flow-limiting.  After the takeoff of      the diagonal branch, the LAD is a severely and diffusely diseased      vessel.  There are septal collaterals to the distal right coronary      circulation.  2.  The circumflex is a small vessel that provides predominantly one      marginal branch.  It is severely and diffusely diseased.  It is very      small in caliber.  3.  The right coronary artery is totally occluded in the proximal portion of      the mid vessel.  4.  Ventriculography was performed in the RAO projection.  Estimated      ejection fraction is in the range of about 45%.  There is a large  segmental wall motion abnormality involving the inferobasal segment.   Following balloon intervention, the right coronary artery demonstrated TIMI  III flow.  There is severe disease proximally with a fair amount of diffuse  spasm. There is a segmental lesion noted distally that was eccentric and  slightly hazy. This was reduced from 70% down to 0% with a Taxus drug-  eluting stent. The more proximal lesion was reduced from 100-90 to 0%, also  using a Taxus drug-eluting stent.  Following balloon dilatation, both  lesions were reduced to 0% residual luminal narrowing. The PDA had some mild  proximal irregularity.  There is a large posterolateral segment with  excellent TIMI III flow in multiple posterolateral branches which all have a  fair amount of luminal irregularity.   CONCLUSION:  1.  Acute inferior wall myocardial infarction due to total occlusion of the      right coronary with successful reperfusion therapy and associated wall      motion abnormality.  2.  Moderately high grade stenosis of the proximal left anterior descending      artery with diffuse disease of the distal LAD.  3.  Small  insignificant diffusely irregular circumflex coronary arteries as      described above.   DISPOSITION:  The patient will likely need restudy on Friday.  This will be  set up per Dr. Samule Ohm.      Arturo Morton. Riley Kill, M.D. [facility]  Electronically Signed     TDS/MEDQ  D:  03/16/2006  T:  03/16/2006  Job:  [id number]

## 2011-03-18 NOTE — Assessment & Plan Note (Signed)
[facility]                              CARDIOLOGY OFFICE NOTE   NAME:Trautner, VONN SLIGER                      MRN:          [id number]  DATE:08/17/2006                            DOB:          1951-08-26    Mr. Aird returns for followup today.  He has a history of coronary  disease, ICD secondary to ventricular tachycardia, and mildly reduced LV  function.  Since I last saw him, we did proceed with a CTA of his lower  extremities due to leg pain.  He was found to have mild bilateral SFA and  proximal popliteal disease of doubtful hemodynamic significance.  There was  disease but contiguous three vessel runoff on the left and two vessel runoff  on the right.  There was also note of diverticulosis.  We also asked Mr.  Kirker to follow up with Dr. Neita Carp concerning his thyroid functions,  anemia, and pulmonary disease.  He has done this, and he has told me that  his thyroid tests were repeated and were unremarkable, and he has now had a  colonoscopy that was also unremarkable.  He continues to have complaints of  dyspnea on exertion, fatigue, occasional chest pain, and hip and lower  extremity pain predominantly in the knees.  His symptoms were unchanged from  previous.   His medications at present include Zetia 10 mg p.o. daily, __________1  daily, AcipHex 20 mg p.o. daily, Coreg 25 mg p.o. b.i.d., Altace 10 mg p.o.  daily, Glucotrol XL 10 mg p.o. b.i.d., Cymbalta 60 mg p.o. daily, Plavix 75  mg p.o. daily, hydrochlorothiazide 25 mg p.o. daily, Actos 30 mg p.o. daily,  aspirin 325 mg p.o. daily, vitamin B complex and albuterol as needed.   PHYSICAL EXAMINATION:  VITAL SIGNS:  Blood pressure 149/74, and his pulse is  76.  He weighs 205 pounds.  NECK:  Supple with no bruits.  CHEST:  Clear.  CARDIOVASCULAR:  Regular rate and rhythm.  ABDOMEN:  No pulsatile masses.  No bruits.  EXTREMITIES:  No edema.  He has 2+ posterior tibial pulses  bilaterally.   DIAGNOSES:  1. Coronary artery disease with history of drug-eluting stents to the      right coronary artery and left anterior descending artery.  2. History of mildly reduced left ventricular function.  3. History of ventricular tachycardia, status post implantable      cardioverter/defibrillator.  4. Hypertension.  5. Hyperlipidemia.  6. Diabetes mellitus.  7. Chronic obstructive pulmonary disease.  8. History of microcytic anemia.  9. Tobacco abuse.  10.History of intolerance to statins.   PLAN:  Mr. Girouard continues to have multiple complaints.  He has had chest  pain, but we did perform a cardiac catheterization on Mar 24, 2006.  He was  found to have a 30% left main and a 30% mid LAD.  The distal stent was  widely patent.  There was really no circumflex artery.  The right coronary  artery was large, and the proximal distal stents were widely patent.  The  PDA had a 30-40% ostial lesion.  There  were 50-60% stenosis in the  posterolateral.  His ejection fraction was 45%.  Also note he had a previous  nuclear study in [place], interpreted by Dr. [name] on Mar 02, 2006 that showed  no ischemia and a prior inferior infarct.  Also note, the previous  echocardiogram showed an ejection fraction of 40% with inferior hypokinesis.  When I last saw him, we did check a BNP that was normal at 32.  I therefore  feel that we have evaluated Mr. Suder extensively, and there is mildly  reduced LV function with the prior inferior infarct but no other  abnormalities are noted.  He also now has an ICD in place.  We will continue  with the Zetia, and as noted above, he has not tolerated his statins.  He  will continue on his aspirin and Plavix as well as his Coreg and Altace.  He  has asked again about disability.  I do not think that he is disabled from a  cardiac standpoint, and I have stated that he can return to work on August 23, 2006.  If he has other medical issues that cause  him to be disabled,  then I have asked him to discuss this with Dr. Neita Carp.  I have also asked  him to follow up with Dr. Neita Carp concerning his history of microcytic  anemia, mild thyroid abnormality, and probable COPD.  We will check a BNP  today to follow his renal function.  We will see him back in six months.            ______________________________  Madolyn Frieze. Jens Som, MD, [facility]     BSC/MedQ  DD:  08/17/2006  DT:  08/18/2006  Job #:  [id number]   cc:   Fara Chute

## 2011-03-18 NOTE — Assessment & Plan Note (Signed)
[facility]                              CARDIOLOGY OFFICE NOTE   NAME:[name] [name], [name] [name]                      MRN:          [id number]  DATE:06/14/2006                            DOB:          May 17, 1951    [name] [name] returns for followup today.  Please refer to my note of May 31, 2006 for details.  Since that time we have been able to obtain some records  from [place].  The patient did have pulmonary function tests performed that  showed a moderate obstructive defect and there is severe decrease in  diffusing capacity suggesting emphysema.  He also had an echocardiogram  performed at [facility] on May 12, 2006.  This is interpreted by Dr. [name] and  showed the ejection fraction of 40% with inferior hypokinesis and mild  mitral and tricuspid regurgitation.  A CT of his head in July showed no  acute intracranial abnormality.  He had laboratories drawn in [place] in July  as well that showed a decreased TSH but the T3 uptake, thyroxine and free-  thyroxine index were normal.  He also has had a microcytic anemia.  The  patient also had a nuclear study performed on Mar 02, 2006 that showed no  significant ischemia, but there was a prior inferior infarct.  His cath  results were outlined in my previous note.  Also note the patient had lower  extremity Dopplers that showed a mid right SFA stenosis over 4 cm greater  than 50% and a mid to distal left SFA stenosis of greater than 50%.  A CTA  was suggested.  Since I saw him previously, [name] [name] continues to have  significant complaints.  He is complaining of fatigue as well as shortness  of breath with activities.  He is also complaining of bilateral low  extremity pain with ambulation.  He also states that he occasionally has  chest tightness when he exerts himself.  However, he has had that for some  time by his report.   CURRENT MEDICATIONS:  1. Plavix 75 mg p.o. q. day.  2. Aspirin 325 mg p.o. q. day.  3.  Coreg 25 mg p.o. b.i.d.  4. Altace 10 mg p.o. q. day.  5. AcipHex.  6. Glucotrol XL 10 mg p.o. b.i.d.  7. Zetia 10 mg p.o. q. day..  8. Nitroglycerin patch 0.2 mg q. day.  9. Actos.  10.Cymbalta.   PHYSICAL EXAMINATION:  VITAL SIGNS:  Blood pressure 126/71. Pulse is 69.  He  weighs 209 pounds.  NECK:  Supple and I cannot appreciate bruits.  CHEST:  Clear.  CARDIOVASCULAR:  Reveals a regular rate and rhythm.  ABDOMEN:  Shows no pulsatile masses, no bruits.  EXTREMITIES:  Show no edema.   An electrocardiogram today shows a normal sinus rhythm at a rate of 69.  The  axis is normal.  There are no ST changes noted.   DIAGNOSES:  1. Coronary artery disease with history of drug-eluting stents to the      right coronary artery and left anterior descending.  2. History of mildly  reduced left ventricular function.  3. History of ventricular tachycardia status post implantable cardioverter-      defibrillator.  4. Hypertension.  5. Hyperlipidemia.  6. Diabetes mellitus.  7. Chronic obstructive pulmonary disease.  8. History of microcytic anemia.  9. Tobacco abuse.  10.History of intolerance to statins.   PLAN:  [name] [name] continues to have multiple complaints.  He has had some  mild chest tightness with more extreme activities but his catheterization in  May revealed non-obstructive coronary disease and his nuclear study was also  performed at that time and showed infarct and no ischemia.  I think we  should continue with medical therapy.  He has also complained of bilateral  lower extremity pain with ambulation and has had normal Dopplers.  We will  schedule him for a CTA to more fully evaluate and he may need a PB consult.  He also is complaining of dyspnea.  I will check a BNP today.  However,  certainly he could be having a contribution to his dyspnea from pulmonary  disease and I have stressed the importance of discontinuing his tobacco use.  I have asked him to follow up with  Dr. Neita Carp for possible therapy for  emphysema and also he needs full evaluation of microcytic anemia.  I have  instructed him that his drug-eluting stents were placed greater than one-  year ago.  If he requires to come off of antiplatelet agents for any  procedure such as colonoscopy or endoscopy, then he could discontinue his  Plavix for a short amount of time but continue his aspirin.  He would then  resume the Plavix immediately following his procedures.  I have also  instructed him that there is some risk with that given his drug-eluting  stents and the risk of thrombosis but this should be diminished given that  these were placed over a year ago.  I have also discussed the patient with  Dr. [name] today.  He has been persistent about his demands for disability  and some of his symptoms seem out-of-proportion to what we have found with  our cardiac testing.  We will continue to follow this.  I told him he could  stay out of work for now.  I will see him back in 8 weeks to review the  above information.  Note we will also repeat his TSH as it was low  previously.  I will check a T3 and T4 as well.  We will continue on with his  cholesterol medicine with a goal LDL of less than 70.                              Madolyn Frieze Jens Som, MD, [facility]    BSC/MedQ  DD:  06/14/2006  DT:  06/14/2006  Job #:  [id number]   cc:   Fara Chute

## 2011-03-18 NOTE — Cardiovascular Report (Signed)
NAMEAHMARION, SARACENO NO.:  [id number]   MEDICAL RECORD NO.:  [id number]          PATIENT TYPE:  INP   LOCATION:  6532                         FACILITY:  [facility]   PHYSICIAN:  Salvadore Farber, M.D. LHCDATE OF BIRTH:  09/08/1951   DATE OF PROCEDURE:  05/16/2005  DATE OF DISCHARGE:                              CARDIAC CATHETERIZATION   PROCEDURE:  Left heart catheterization, left ventriculography, coronary  angiography, drug-eluting stent placement in the mid left anterior  descending.   INDICATIONS:  Mr. Milosevic is a 60 year old gentleman who presented on July 13  with inferior myocardial infarction.  He underwent placement of two drug-  eluting stents in the RCA by Dr. Riley Kill.  Post infarct course has been  uncomplicated.   On Dr. Rosalyn Charters initial diagnostic angiogram the LAD appeared to be  severely and diffusely diseased.  However, the picture was not inconsistent  with spasm.  At that time his blood pressure precluded use of intracoronary  nitroglycerin to assess this question.  He therefore returns to the  laboratory today for planned repeat diagnostic angiography, in particular to  assess the LAD.  Patient was also consented for possible percutaneous  coronary intervention.   PROCEDURAL TECHNIQUE:  Informed consent was obtained.  Under 1% lidocaine  local anesthesia a 5-French sheath was placed in the right common femoral  artery using the modified Seldinger technique.  Diagnostic angiography and  ventriculography were performed using JL4, JR4, and pigtail catheters.  Prior to the imaging of the left system 200 mcg of intracoronary  nitroglycerin was administered.   Images demonstrated that the LAD is diffusely diseased, much of what was  seen previously was spasm.  He did have a focal 80% stenosis of the mid  vessel.  Decision was made to proceed to percutaneous revascularization of  this.   Sheath was up sized over a wire to 6-French.   Anticoagulation was initiated  with bivalirudin.  The patient was maintained on his Plavix.  A 6-French  CLS3.5 guide was advanced over a wire and engaged in the ostium of the left  main.  Prowater wire was advanced to the distal LAD without difficulty.  The  lesion was directly stented using a 2.5 x 12 mm TAXUS at 16 atmospheres.  The mid portion of the stent was then post dilated using a 2.5 x 8 mm  Quantum at 18 atmospheres.  Final angiography demonstrated no residual  stenosis and TIMI 3 flow to the distal vasculature.  The patient tolerated  the procedure well and was transferred to the holding room in stable  condition.   COMPLICATIONS:  None.   FINDINGS:  1.  LV 155/19/24.  EF 64% with inferior akinesis.  2.  No aortic stenosis or mitral regurgitation.  3.  Left main:  30% stenosis of the distal vessel.  4.  LAD:  Moderate sized vessel giving rise to two moderate sized diagonal      branches.  After the administration of intracoronary nitroglycerin it      was clear that much of what was seen last week was coronary spasm.  However, the vessel distal to the first diagonal is clearly diffusely      diseased.  There was an 80% focal stenosis in the mid vessel      successfully stented with drug-eluting stent.  The vessel distal to the      second diagonal was approximately 2 mm in diameter.  There was a 30-40%      stenosis within this portion of the vessel.  5.  Circumflex:  Small vessel which is angiographically normal.  6.  RCA:  Very large, dominant vessel giving rise to a PDA and a very large      posterolateral left ventricular branch.  The previously placed stent in      the proximal and mid vessel are widely patent.  There are serial 80%      stenoses within the posterior left ventricular branch.   IMPRESSION AND PLAN:  The right coronary artery stents are widely patent.  The spasm between the stents is resolved.  The left anterior descending  spasm is resolved and a  focal mid left anterior descending lesion was  treated successfully with drug-eluting stent.  Will plan on conservative  management of the residual disease in his right coronary artery territory as  the more proximal of the two lesions involves a large bifurcation and the  viability of this territory is questionable, at best.       WED/MEDQ  D:  05/16/2005  T:  05/16/2005  Job:  [id number]   cc:   Berlinda Last, M.D.   Learta Codding, M.D. [facility]  [address]  [address]  [place]  [place] [address]

## 2011-03-18 NOTE — Cardiovascular Report (Signed)
NAMEYORDAN, [name] [name] NO.:  [id number]   MEDICAL RECORD NO.:  [id number]          PATIENT TYPE:  INP   LOCATION:  2925                         FACILITY:  [facility]   PHYSICIAN:  Charlton Haws, M.D.     DATE OF BIRTH:  Oct 13, 1951   DATE OF PROCEDURE:  03/24/2006  DATE OF DISCHARGE:                              CARDIAC CATHETERIZATION   PROCEDURE:  Arteriography.   INDICATIONS:  Increasing VT, history coronary disease.   Cine catheterization was done from the right femoral artery using 6-French  catheters.  At the end of the case, hand injection of the right SFA and  iliac showed [place] to be in good position for use of Angio-Seal.  The device  was delivered with good hemostasis.   Left main coronary artery had a 30% distal stenosis.   Left anterior descending artery had 30% midvessel disease.  The distal stent  was widely patent.  The distal LAD was small with diffuse disease.  The  first, second and third diagonal branches had no critical disease.   There was really no circumflex artery.   The right coronary artery was large.  The proximal and distal stents were  widely patent.  The PDA had 30-40% ostial disease.  The distal PLA, which  supplied the circumflex territory, had 50-60% multiple discrete lesions,  which is unchanged from previous.   RAO ventriculography:  RAO ventriculography showed inferobasal wall  hypokinesis.  EF was in the 45% range.  There was no gradient across the  aortic valve and no MR.  Aortic pressure was 142/72, LV pressure was 143/17.   IMPRESSION:  The patient does not have critical coronary disease.  His  stents were widely patent.  He will have a follow-up EP study.  A note  should be made that he also has guaiac-positive stools and will need a GI  evaluation.   The patient tolerated the procedure well.           ______________________________  Charlton Haws, M.D.     PN/MEDQ  D:  03/24/2006  T:  03/24/2006  Job:  [id number]

## 2011-03-18 NOTE — Cardiovascular Report (Signed)
[name] [name], [name] [name] NO.:  [id number]   MEDICAL RECORD NO.:  [id number]                   PATIENT TYPE:  INP   LOCATION:  3728                                 FACILITY:  [facility]   PHYSICIAN:  Vida Roller, M.D.                DATE OF BIRTH:  [birth year]   DATE OF PROCEDURE:  06/25/2003  DATE OF DISCHARGE:                              CARDIAC CATHETERIZATION   REASON FOR EVALUATION:  This is a 60 year old gentleman with hypertension  and hyperlipidemia who presents with unstable angina and was referred for  heart catheterization.   DETAILS OF PROCEDURE:  After obtaining informed consent the patient was  brought to the cardiac catheterization laboratory in the fasting state.  There, he was prepped and draped in the usual sterile manner; and local  anesthetic was obtained over the right groin using 1% lidocaine without  epinephrine.   The right femoral vein was cannulated using the modified Seldinger technique  with a 6 French 10-cm sheath and left heart catheterization was performed  using a 6 French Judkins left #4, and a 6 [place] Judkins right #4.  Left  ventriculography was performed in the 30-degree RAO view with a 6 French  pigtail catheter and a power injector.  At the conclusions of the procedure  the catheters were removed.  The sheath was sewn into place and the patient  was referred for percutaneous coronary revascularization.   RESULTS:  The aortic pressure was measured at 145/78 with a mean arterial  pressure of 105 mmHg. Left ventricular pressure was measured at 147/9 with  an end-diastolic pressure of 21 mmHg.   SELECTIVE CORONARY ANGIOGRAPHY:  1. The left main coronary artery is a moderate caliber vessel which is     angiographically unremarkable.  2. The left circumflex coronary artery is a small diminutive vessel which     has no significant coronary disease.  3. The left anterior descending coronary artery is a  small-to-moderate     caliber vessel which does not reach the apex.  There is a 50% lesion     between the first and second diagonals.  Both diagonals appear to be     small vessels.  4. 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  There is a 50% lesion at the transition point between the     posterior descending coronary artery and the vessels that feed the     lateral wall.  The right coronary artery essentially has most of the     circumflex distribution from its distal portion.  There is a moderate     caliber posterior descending coronary artery; and a moderate caliber     posterolateral branch; and then an artery that traverses the entire AV     groove of the heart all the way from right to left, giving off  small-to-     moderate caliber obtuse marginals and finally terminating in an atrial     circumflex branch.  5. The renal arteries were evaluated using a pigtail catheter and a power     injector as well and they appear to be widely patent.  The left     ventricular function is normal at 70% with no wall motion abnormalities     and no mitral regurgitation.   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  2. Two-vessel coronary disease with significant disease in the right     coronary artery.  3. Normal left ventricular function.    PLAN:  Our plan is for percutaneous intervention to the lesion in the right  coronary artery and we will perform that today, Dr. Gerri Spore.                                               Vida Roller, M.D.    JH/MEDQ  D:  06/25/2003  T:  06/25/2003  Job:  [id number]

## 2011-03-18 NOTE — Discharge Summary (Signed)
[name] [name], [name] [name] NO.:  [id number]   MEDICAL RECORD NO.:  [id number]          PATIENT TYPE:  INP   LOCATION:  6532                         FACILITY:  [facility]   PHYSICIAN:  Salvadore Farber, M.D. LHCDATE OF BIRTH:  January 15, 1951   DATE OF ADMISSION:  05/12/2005  DATE OF DISCHARGE:  05/17/2005                                 DISCHARGE SUMMARY   PRIMARY CARDIOLOGIST:  Learta Codding, M.D.   PRINCIPAL DIAGNOSIS:  Inferior ST elevation myocardial infarction/coronary  artery disease.   OTHER DIAGNOSES:  1.  Hypertension.  2.  Hyperlipidemia.  3.  Type 2 diabetes mellitus.  4.  Chronic obstructive pulmonary disease.  5.  Tobacco abuse.  6.  Erectile dysfunction.  7.  Plantar fasciitis.   ALLERGIES:  No known drug allergies.   PROCEDURES:  Left heart cardiac catheterization with percutaneous coronary  intervention and stenting of the right coronary artery and subsequent  percutaneous coronary intervention and stenting of the left anterior  descending coronary artery.   HISTORY OF PRESENT ILLNESS:  A 60 year old married white male with prior  history of insignificant coronary disease, who was in his usual state of  health for several days prior to admission when he began to experience  substernal chest discomfort at rest.  On the morning of July 13 at  approximately 10:30 a.m., while getting ready to shower he developed an  intensive sternal chest pressure with diaphoresis radiating down his left  arm.  EMS was called and on their arrival, ECG revealed 2 mm ST segment  elevation in leads II, III and aVF with 1 mm ST segment depression in I and  aVL.  He was managed with aspirin and transported emergently to [facility].  In the emergency room he was treated with heparin and IV morphine and  sublingual nitroglycerin and was taken emergently to the cardiac  catheterization lab.   HOSPITAL COURSE:  Catheterization on July 13 revealed a 60% stenosis of the  proximal LAD with severe disease in the mid-LAD versus coronary vasospasm.  The infarct vessel was the right coronary artery.  There was a total  occlusion of the proximal RCA with a 70% lesion further down.  The proximal  RCA was treated with a 3.5 x 20 mm Taxus drug-eluting stent.  The distal RCA  was treated with a 3.5 x 12 mm Taxus drug-eluting stent.  He was monitored  in the CCU, where he did well.  He was transferred out to the floor on July  14.  During his hospitalization he was counseled on the importance of  tobacco cessation, and the patient is motivated to quit.  Given concern for  LAD disease noted on initial catheterization, he was taken back to the  catheterization lab on July 17 for re-look catheterization showing patency  of the RCA stents, with an 80% stenosis in the mid-LAD.  This was  successfully treated with a 2.5 x 12 mm Taxus drug-eluting stent.  He  tolerated this procedure well.   During this admission secondary to markedly elevated hemoglobin A1c at 9.3,  [facility] was consulted and assisted in improving his  diabetes regimen.  He previously was only taking metformin, which was  discontinued, and the patient was reluctant to be started on insulin.  He is  now on Glucotrol XL 10 mg b.i.d. as well as Avandia 4 mg b.i.d.   The patient is ambulating in the halls this morning without return of chest  discomfort and is being discharged home today in satisfactory condition.   DISCHARGE LABORATORY DATA:  Hemoglobin 11.4, hematocrit 33.2, WBC 5.8,  platelets 208.  Sodium 136, potassium 4.1, chloride 103, CO2 24, BUN 9,  creatinine 1.2, glucose 111.  Total bilirubin 0.6, alkaline phosphatase 65,  AST 25, ALT 22, albumin 3.8.  Peak CK 1586, peak MB 60.1, peak troponin I  54.94.  Total cholesterol 130, triglycerides 255, HDL 27, LDL 52.  Calcium  9.1.  TSH 0.464.  Hemoglobin A1c 9.3.   DISPOSITION:  The patient is being discharged home today in good  condition.   FOLLOW-UP PLANS:  He is asked to follow with his primary care Lurena Naeve, Dr.  Neita Carp, in one to two weeks.  He is to follow up at cardiology clinic with  Dr. [name] on July 26 at 3 p.m.   DISCHARGE MEDICATIONS:  1.  Plavix 75 mg daily.  2.  Aspirin 325 mg daily.  3.  Nitroglycerin 0.4 mg sublingual p.r.n. chest pain.  4.  Coreg 25 mg b.i.d.  5.  Altace 10 mg daily.  6.  Aciphex 20 mg daily.  7.  Elavil 25 mg q.h.s.  8.  Albuterol inhaler p.r.n.  9.  Glucotrol XL 10 mg b.i.d.  10. Avandia 4 mg b.i.d.  11. Zetia 10 mg daily.  12. Wellbutrin SR 150 mg one p.o. daily x3 days, then b.i.d.  13. Nicotine patch as directed.   OUTSTANDING LAB STUDIES:  None.   DURATION OF DISCHARGE ENCOUNTER:  45 minutes.       CRB/MEDQ  D:  05/17/2005  T:  05/17/2005  Job:  [id number]

## 2011-03-18 NOTE — Discharge Summary (Signed)
NAMEANANDA, SITZER NO.:  [id number]   MEDICAL RECORD NO.:  [id number]          PATIENT TYPE:  INP   LOCATION:  3739                         FACILITY:  [facility]   PHYSICIAN:  Arvilla Meres, M.D. LHCDATE OF BIRTH:  1950/12/11   DATE OF ADMISSION:  03/23/2006  DATE OF DISCHARGE:  03/25/2006                                 DISCHARGE SUMMARY   PROCEDURES:  1.  Insertion of an ICD Medtronic via left subclavian vein.  2.  Cardiac catheterization.  3.  Coronary arteriogram.  4.  Left ventriculogram.   PRIMARY DIAGNOSIS:  Ventricular fibrillation as well as torsades.   SECONDARY DIAGNOSES:  1.  Inferior ST-segment elevation myocardial infarction in July 2006 with a      Taxus stent to the right coronary artery.  2.  Status post history of Taxus stent to the diagonal in 2001.  3.  Hypertension.  4.  Hyperlipidemia.  5.  Diabetes.  6.  Chronic obstructive pulmonary disease.  7.  Ongoing tobacco use.  8.  Erectile dysfunction.  9.  Ischemic cardiomyopathy with an ejection fraction of 45% cath this      admission.  10. Family history of coronary artery disease.  11. Intolerance or allergy to Lipitor and Lescol.  12. Symptoms of obstructive sleep apnea, follow-up sleep study as      outpatient.   TIME OF DISCHARGE:  Thirty-six minutes.   HOSPITAL COURSE:  Mr. Ravenscroft is a 60 year old male with a history of  coronary artery disease.  He was having episodes of presyncope associated  with shortness of breath and diaphoresis.  A monitor was placed which showed  episodes of torsades, ventricular tachycardia as well as fine ventricular  fibrillation.  This eventually resolved on its own and he went back into  sinus rhythm.  After extensive work on the part of the cardiology staff, the  patient was found at a financial office in [place] and EMS transported  him to the hospital.   Mr. Ebel's cardiac enzymes were negative for MI.  The magnesium level was  within  normal limits at 2.2.  He was positive for fecal occult blood and his  wife says that he has previously been diagnosed with anemia and was to  follow up as an outpatient.  A total cholesterol was 155, triglycerides 162,  HDL 27, LDL 96.  Hemoglobin A1c was not performed.   It was felt that Mr. Morad all needed ischemic and EP evaluation.  A  cardiac catheterization showed a left main 30%, LAD 30% with a patent distal  stent and diffuse disease.  There was no real circumflex vessel.  The RCA  had proximal and distal stents that were wide open.  There was a 50-60%  distal PLA/circumflex stenosis but no change from previous cath.  His EF was  45%.  Dr. Eden Emms felt that his coronaries were well revascularized and an EP  consult was called.   Mr. Chaikin was seen by Dr. Ladona Ridgel who felt that an ICD was indicated.  This  was inserted on Mar 24, 2006.  A Medtronic ICD  was inserted and functioned  well.   On Mar 25, 2006, his chest x-ray was without pneumothorax.  His ICD was  without episodes and all values were within normal limits.  He had some  apnea during sleep and his O2 saturation would drop to approximately 85%.  He is to follow up as an outpatient on this.   On Mar 25, 2006, Mr. Axtman was evaluated by Dr. Gala Romney and considered  stable for discharge with outpatient follow-up arranged.   DISCHARGE INSTRUCTIONS:  He is not to drive until he follows up in the  office.  He is to stick to a low-fat, salt and diabetic diet.  He is to  increase his activity per the activity sheet.  He is to follow up with Dr.  Neita Carp as needed.  He is to follow up with Dr. Ladona Ridgel in three months.  He  is to follow up with Dr. [name] as well.  He is to follow up in the ICD  clinic in two weeks and they will call.   DISCHARGE MEDICATIONS:  1.  Aspirin 325 mg day.  2.  Plavix 75 mg daily a day.  3.  Coreg 25 mg b.i.d.  4.  Altace 10 mg daily.  5.  AcipHex 20 mg a day.  6.  Glucotrol XL 10 mg b.i.d.   7.  Zetia 10 mg a day.  8.  Actos 30 mg daily.  9.  Cymbalta 60 mg a day.  10. Imdur as prior to admission.  11. Albuterol MDI as prior to admission.  12. Quinine 324 mg daily p.r.n.  13. Duo-Neb by inhalation t.i.d. p.r.n.  14. Xanax 0.5 mg t.i.d. p.r.n.   (Mr. Steers and his wife were advised that we would only give him a short-  term prescription for the quinine, Duo-Neb and Xanax as he should follow up  with Dr. Neita Carp if more of these medications are needed.) Mr. Maslowski is  cleared to use a nicotine patch but understands that he must not smoke while  wearing it.  He is also complaining of some rib pain and was advised to use  Tylenol.      Theodore Demark, P.A. [facility]      Arvilla Meres, M.D. [facility]  Electronically Signed   RB/MEDQ  D:  03/25/2006  T:  03/27/2006  Job:  [id number]

## 2011-03-18 NOTE — Assessment & Plan Note (Signed)
[facility]                           ELECTROPHYSIOLOGY OFFICE NOTE   NAME:[name], [name] HARRIOTT                      MRN:          [id number]  DATE:06/20/2006                            DOB:          03-07-1951    [name] [name] returns today for followup.  He is a very pleasant middle-aged  man with a history of VT and carotid disease, who is status post ICD  insertion.  The patient has had no recurrent defibrillator shocks.  He does  continue to complain of pain in his legs when he walks.   PHYSICAL EXAMINATION:  GENERAL:  He is a pleasant well-appearing middle-aged  man in no acute distress.  VITAL SIGNS:  Blood pressure was 140/70.  The pulse 65 and regular.  Respirations 18.  Weight was 207 pounds.  NECK:  Revealed no jugular venous distention.  LUNGS:  Clear bilaterally auscultation.  CARDIOVASCULAR:  Revealed a regular rate and rhythm with normal S1 and S2.  EXTREMITIES:  Demonstrated no cyanosis, clubbing, or edema.   Interrogation of his defibrillator demonstrates current __________  .  His R  waves were 5.  Patient impedance 392 ohm and the threshold 1.5 volts at 0.9  msec.  This is up from his implant data.  Battery voltage 3.2 volts.  Charge  time 8 seconds.  No __________ were noted.  Today his output was increased  to 3.9 in the ventricle.   IMPRESSION:  1. Ischemic cardiomyopathy.  2. Ventricular tachycardia.  3. Status post implantable cardioverter-defibrillator insertion.  4. Peripheral vascular disease.   DISCUSSION:  [name] [name] is scheduled for a CT scan today.  We will plan to  see him back in [facility] and followup in our office in 1 year.                                   [name] [name]. [name] Ridgel, MD   GWT/MedQ  DD:  06/20/2006  DT:  06/20/2006  Job #:  [id number]   cc:   [name] [name]

## 2011-04-22 ENCOUNTER — Encounter: Payer: Self-pay | Admitting: *Deleted

## 2011-08-01 ENCOUNTER — Encounter: Payer: Self-pay | Admitting: Internal Medicine

## 2011-08-01 ENCOUNTER — Ambulatory Visit (INDEPENDENT_AMBULATORY_CARE_PROVIDER_SITE_OTHER): Payer: Medicare Other | Admitting: *Deleted

## 2011-08-01 DIAGNOSIS — I472 Ventricular tachycardia: Secondary | ICD-10-CM

## 2011-08-01 NOTE — Progress Notes (Signed)
ICD check 

## 2011-12-16 ENCOUNTER — Encounter: Payer: Self-pay | Admitting: Internal Medicine

## 2011-12-16 ENCOUNTER — Telehealth: Payer: Self-pay | Admitting: Internal Medicine

## 2011-12-16 NOTE — Telephone Encounter (Signed)
12-16-11 n/a, unable to leave message, sent past due letter/mt

## 2012-03-22 ENCOUNTER — Encounter: Payer: Self-pay | Admitting: *Deleted

## 2012-03-26 ENCOUNTER — Encounter: Payer: Self-pay | Admitting: Internal Medicine

## 2012-03-26 ENCOUNTER — Telehealth: Payer: Self-pay | Admitting: Internal Medicine

## 2012-03-26 NOTE — Telephone Encounter (Signed)
03-26-12 sent pt past due letter to see taylor foe defib ck /mt

## 2012-05-30 ENCOUNTER — Encounter: Payer: Self-pay | Admitting: Internal Medicine

## 2012-05-30 ENCOUNTER — Ambulatory Visit (INDEPENDENT_AMBULATORY_CARE_PROVIDER_SITE_OTHER): Payer: Medicare Other | Admitting: *Deleted

## 2012-05-30 DIAGNOSIS — I2589 Other forms of chronic ischemic heart disease: Secondary | ICD-10-CM

## 2012-05-30 LAB — ICD DEVICE OBSERVATION
BRDY-0002RV: 40 {beats}/min
CHARGE TIME: 8.95 s
DEV-0020ICD: NEGATIVE
FVT: 0
RV LEAD AMPLITUDE: 4.3 mv
RV LEAD IMPEDENCE ICD: 480 Ohm
RV LEAD THRESHOLD: 2 V
TZAT-0001SLOWVT: 1
TZAT-0001SLOWVT: 2
TZAT-0005SLOWVT: 91 pct
TZAT-0011FASTVT: 10 ms
TZAT-0013SLOWVT: 1
TZAT-0013SLOWVT: 2
TZAT-0018FASTVT: NEGATIVE
TZAT-0018SLOWVT: NEGATIVE
TZAT-0018SLOWVT: NEGATIVE
TZAT-0019FASTVT: 8 V
TZAT-0019SLOWVT: 8 V
TZAT-0019SLOWVT: 8 V
TZAT-0020FASTVT: 1.6 ms
TZAT-0020SLOWVT: 1.6 ms
TZAT-0020SLOWVT: 1.6 ms
TZON-0004SLOWVT: 44
TZON-0005SLOWVT: 12
TZON-0008FASTVT: 0 ms
TZON-0008SLOWVT: 0 ms
TZST-0001FASTVT: 3
TZST-0001FASTVT: 4
TZST-0001FASTVT: 6
TZST-0001SLOWVT: 3
TZST-0001SLOWVT: 6
TZST-0003FASTVT: 35 J
TZST-0003FASTVT: 35 J
TZST-0003SLOWVT: 35 J

## 2012-05-30 NOTE — Progress Notes (Signed)
ICD check 

## 2012-08-22 ENCOUNTER — Encounter: Payer: Self-pay | Admitting: *Deleted

## 2012-08-29 ENCOUNTER — Encounter: Payer: Self-pay | Admitting: Internal Medicine

## 2012-08-29 ENCOUNTER — Ambulatory Visit (INDEPENDENT_AMBULATORY_CARE_PROVIDER_SITE_OTHER): Payer: Medicare Other | Admitting: Internal Medicine

## 2012-08-29 VITALS — BP 144/64 | HR 71 | Ht 69.0 in | Wt 190.0 lb

## 2012-08-29 DIAGNOSIS — I2589 Other forms of chronic ischemic heart disease: Secondary | ICD-10-CM

## 2012-08-29 DIAGNOSIS — Z95810 Presence of automatic (implantable) cardiac defibrillator: Secondary | ICD-10-CM

## 2012-08-29 DIAGNOSIS — F17200 Nicotine dependence, unspecified, uncomplicated: Secondary | ICD-10-CM

## 2012-08-29 NOTE — Assessment & Plan Note (Signed)
I continue to strongly encouraged the patient to stop smoking. The patient is currently not interested in stopping.

## 2012-08-29 NOTE — Assessment & Plan Note (Signed)
The patient denies anginal symptoms. He'll continue his current medical therapy. I've strongly encouraged the patient to stop smoking.

## 2012-08-29 NOTE — Patient Instructions (Signed)
Your physician wants you to follow-up in: 1 year in [place] with Dr. Ladona Ridgel. You will receive a reminder letter in the mail two months in advance. If you don't receive a letter, please call our office to schedule the follow-up appointment.

## 2012-08-29 NOTE — Assessment & Plan Note (Signed)
His device is working normally. We'll plan to recheck in several months. 

## 2012-08-29 NOTE — Progress Notes (Signed)
HPI [name] returns today for followup. He is a very pleasant middle-age man with an ischemic cardiomyopathy, status post MI, with chronic systolic heart failure, status post ICD implantation. In the interim, he has done well. He denies chest pain or shortness of breath. No syncope. He continues to smoke cigarettes at over a pack and a half per day. He has mild claudication in his lower extremities. Allergies  Allergen Reactions  . Statins      Current Outpatient Prescriptions  Medication Sig Dispense Refill  . Albuterol Sulfate (PROAIR HFA IN) Inhale into the lungs.      Marland Kitchen aspirin 81 MG tablet Take 81 mg by mouth daily.      . carvedilol (COREG) 25 MG tablet Take 25 mg by mouth 2 (two) times daily.      . Colchicine (COLCRYS PO) Take by mouth.      . cyclobenzaprine (FLEXERIL) 10 MG tablet Take by mouth.       . diazepam (VALIUM) 5 MG tablet Take 5 mg by mouth.       . Fluticasone-Salmeterol (ADVAIR HFA IN) Inhale into the lungs.      . gabapentin (NEURONTIN) 300 MG capsule Take 300 mg by mouth 4 (four) times daily.       Marland Kitchen glipiZIDE (GLUCOTROL XL) 10 MG 24 hr tablet Take 10 mg by mouth 2 (two) times daily.       Marland Kitchen HYDROmorphone (DILAUDID) 4 MG tablet Take 4 mg by mouth.       . metFORMIN (GLUCOPHAGE-XR) 500 MG 24 hr tablet Take 500 mg by mouth 4 (four) times daily.       Marland Kitchen omeprazole (PRILOSEC) 40 MG capsule Take 40 mg by mouth daily.       . ramipril (ALTACE) 10 MG capsule Take 10 mg by mouth daily.       . Tiotropium Bromide Monohydrate (SPIRIVA HANDIHALER IN) Inhale into the lungs.      Marland Kitchen ZETIA 10 MG tablet Take 10 mg by mouth daily.          Past Medical History  Diagnosis Date  . Heart attack   . Low back pain   . Diabetic neuropathy   . Diabetes type 2, controlled   . Gout   . Hypertension   . Erectile dysfunction   . CAD (coronary artery disease)   . Hypercholesteremia   . Anemia     ROS:   All systems reviewed and negative except as noted in the HPI.   Past  Surgical History  Procedure Date  . Coronary angioplasty with stent placement   . Insert / replace / remove pacemaker      No family history on file.   History   Social History  . Marital Status: Legally Separated    Spouse Name: N/A    Number of Children: N/A  . Years of Education: N/A   Occupational History  . Not on file.   Social History Main Topics  . Smoking status: Current Every Day Smoker  . Smokeless tobacco: Not on file  . Alcohol Use:   . Drug Use: No  . Sexually Active:    Other Topics Concern  . Not on file   Social History Narrative  . No narrative on file     BP 144/64  Pulse 71  Ht 5\' 9"  (1.753 m)  Wt 190 lb (86.183 kg)  BMI 28.06 kg/m2  SpO2 94%  Physical Exam:  Well appearing middle-aged man, NAD  HEENT: Unremarkable Neck:  No JVD, no thyromegally Lungs:  Clear with no wheezes, rales, or rhonchi. HEART:  Regular rate rhythm, no murmurs, no rubs, no clicks Abd:  soft, positive bowel sounds, no organomegally, no rebound, no guarding Ext:  2 plus pulses, no edema, no cyanosis, no clubbing Skin:  No rashes no nodules Neuro:  CN II through XII intact, motor grossly intact  DEVICE  Normal device function.  See PaceArt for details.   Assess/Plan:

## 2012-08-30 LAB — ICD DEVICE OBSERVATION
BATTERY VOLTAGE: 2.97 V
BRDY-0002RV: 40 {beats}/min
CHARGE TIME: 8.95 s
RV LEAD IMPEDENCE ICD: 440 Ohm
TZAT-0001FASTVT: 1
TZAT-0001SLOWVT: 1
TZAT-0001SLOWVT: 2
TZAT-0004FASTVT: 8
TZAT-0004SLOWVT: 8
TZAT-0004SLOWVT: 8
TZAT-0005FASTVT: 88 pct
TZAT-0018FASTVT: NEGATIVE
TZAT-0020SLOWVT: 1.6 ms
TZAT-0020SLOWVT: 1.6 ms
TZON-0011AFLUTTER: 70
TZST-0001FASTVT: 2
TZST-0001FASTVT: 5
TZST-0001FASTVT: 6
TZST-0001SLOWVT: 3
TZST-0001SLOWVT: 5
TZST-0003FASTVT: 35 J
TZST-0003FASTVT: 35 J
TZST-0003SLOWVT: 25 J
TZST-0003SLOWVT: 35 J
VF: 0

## 2012-12-03 ENCOUNTER — Encounter: Payer: Medicare Other | Admitting: *Deleted

## 2012-12-07 ENCOUNTER — Encounter: Payer: Self-pay | Admitting: *Deleted

## 2013-04-16 ENCOUNTER — Telehealth: Payer: Self-pay | Admitting: Internal Medicine

## 2013-04-16 ENCOUNTER — Encounter: Payer: Self-pay | Admitting: Internal Medicine

## 2013-04-16 NOTE — Telephone Encounter (Signed)
04-16-13 sent past due letter, missed remote check in February/mt

## 2013-09-03 ENCOUNTER — Encounter: Payer: Medicare Other | Admitting: Internal Medicine

## 2013-10-04 ENCOUNTER — Encounter: Payer: Self-pay | Admitting: Internal Medicine

## 2013-10-04 ENCOUNTER — Ambulatory Visit (INDEPENDENT_AMBULATORY_CARE_PROVIDER_SITE_OTHER): Payer: Medicare Other | Admitting: Internal Medicine

## 2013-10-04 VITALS — Ht 70.0 in | Wt 185.0 lb

## 2013-10-04 DIAGNOSIS — I10 Essential (primary) hypertension: Secondary | ICD-10-CM

## 2013-10-04 DIAGNOSIS — Z95810 Presence of automatic (implantable) cardiac defibrillator: Secondary | ICD-10-CM

## 2013-10-04 DIAGNOSIS — I2589 Other forms of chronic ischemic heart disease: Secondary | ICD-10-CM

## 2013-10-04 DIAGNOSIS — I472 Ventricular tachycardia: Secondary | ICD-10-CM

## 2013-10-04 LAB — MDC_IDC_ENUM_SESS_TYPE_INCLINIC
Brady Statistic RV Percent Paced: 0 %
HighPow Impedance: 46 Ohm
HighPow Impedance: 47 Ohm
HighPow Impedance: 48 Ohm
HighPow Impedance: 49 Ohm
HighPow Impedance: 49 Ohm
HighPow Impedance: 51 Ohm
HighPow Impedance: 52 Ohm
HighPow Impedance: 52 Ohm
HighPow Impedance: 53 Ohm
HighPow Impedance: 54 Ohm
HighPow Impedance: 62 Ohm
HighPow Impedance: 64 Ohm
HighPow Impedance: 65 Ohm
HighPow Impedance: 67 Ohm
HighPow Impedance: 69 Ohm
HighPow Impedance: 74 Ohm
HighPow Impedance: 76 Ohm
Lead Channel Impedance Value: 464 Ohm
Lead Channel Impedance Value: 472 Ohm
Lead Channel Impedance Value: 480 Ohm
Lead Channel Impedance Value: 488 Ohm
Lead Channel Impedance Value: 488 Ohm
Lead Channel Impedance Value: 488 Ohm
Lead Channel Impedance Value: 496 Ohm
Lead Channel Impedance Value: 496 Ohm
Lead Channel Impedance Value: 504 Ohm
Lead Channel Pacing Threshold Amplitude: 2 V
Lead Channel Sensing Intrinsic Amplitude: 3.3 mV
Lead Channel Sensing Intrinsic Amplitude: 3.3 mV
Lead Channel Sensing Intrinsic Amplitude: 3.6 mV
Lead Channel Sensing Intrinsic Amplitude: 3.6 mV
Lead Channel Sensing Intrinsic Amplitude: 3.7 mV
Lead Channel Sensing Intrinsic Amplitude: 3.7 mV
Lead Channel Sensing Intrinsic Amplitude: 3.7 mV
Lead Channel Sensing Intrinsic Amplitude: 3.9 mV
Lead Channel Sensing Intrinsic Amplitude: 3.9 mV
Lead Channel Sensing Intrinsic Amplitude: 4.1 mV
Lead Channel Sensing Intrinsic Amplitude: 4.4 mV
Lead Channel Setting Pacing Amplitude: 4 V
Lead Channel Setting Pacing Pulse Width: 0.9 ms
Zone Setting Detection Interval: 300 ms
Zone Setting Detection Interval: 350 ms

## 2013-10-04 NOTE — Assessment & Plan Note (Signed)
His systolic blood pressure is elevated today. At home, and with other doctors visits, he states that his blood pressure has been well-controlled.

## 2013-10-04 NOTE — Progress Notes (Signed)
HPI [name] [name] returns today for followup. He is a very pleasant middle-age man with an ischemic cardiomyopathy, status post MI, with chronic systolic heart failure, status post ICD implantation. In the interim, he has done well. He denies chest pain or shortness of breath. No syncope. He continues to smoke cigarettes at over a pack per day. He has mild claudication in his lower extremities. Allergies  Allergen Reactions  . Statins      Current Outpatient Prescriptions  Medication Sig Dispense Refill  . Albuterol Sulfate (PROAIR HFA IN) Inhale into the lungs.      . ALPRAZolam (XANAX) 1 MG tablet       . aspirin 81 MG tablet Take 81 mg by mouth daily.      . carvedilol (COREG) 25 MG tablet Take 25 mg by mouth 2 (two) times daily.      . Colchicine (COLCRYS PO) Take by mouth.      . cyclobenzaprine (FLEXERIL) 10 MG tablet Take by mouth.       . diazepam (VALIUM) 5 MG tablet Take 5 mg by mouth.       . Fluticasone-Salmeterol (ADVAIR HFA IN) Inhale into the lungs.      . gabapentin (NEURONTIN) 300 MG capsule Take 300 mg by mouth 4 (four) times daily.       Marland Kitchen glipiZIDE (GLUCOTROL XL) 10 MG 24 hr tablet Take 10 mg by mouth 2 (two) times daily.       Marland Kitchen HYDROmorphone (DILAUDID) 4 MG tablet Take 4 mg by mouth.       . metFORMIN (GLUCOPHAGE-XR) 500 MG 24 hr tablet Take 500 mg by mouth 4 (four) times daily.       Marland Kitchen omeprazole (PRILOSEC) 40 MG capsule Take 40 mg by mouth daily.       . promethazine (PHENERGAN) 25 MG tablet       . ramipril (ALTACE) 10 MG capsule Take 10 mg by mouth daily.       . Tiotropium Bromide Monohydrate (SPIRIVA HANDIHALER IN) Inhale into the lungs.      Marland Kitchen ZETIA 10 MG tablet Take 10 mg by mouth daily.        No current facility-administered medications for this visit.     Past Medical History  Diagnosis Date  . Heart attack   . Low back pain   . Diabetic neuropathy   . Diabetes type 2, controlled   . Gout   . Hypertension   . Erectile dysfunction   . CAD  (coronary artery disease)   . Hypercholesteremia   . Anemia     ROS:   All systems reviewed and negative except as noted in the HPI.   Past Surgical History  Procedure Laterality Date  . Coronary angioplasty with stent placement    . Insert / replace / remove pacemaker       History reviewed. No pertinent family history.   History   Social History  . Marital Status: Legally Separated    Spouse Name: N/A    Number of Children: N/A  . Years of Education: N/A   Occupational History  . Not on file.   Social History Main Topics  . Smoking status: Current Every Day Smoker  . Smokeless tobacco: Not on file  . Alcohol Use:   . Drug Use: No  . Sexual Activity:    Other Topics Concern  . Not on file   Social History Narrative  . No narrative on file  Ht 5\' 10"  (1.778 m)  Wt 185 lb (83.915 kg)  BMI 26.54 kg/m2  Physical Exam:  Well appearing middle-aged man, NAD HEENT: Unremarkable Neck:  No JVD, no thyromegally Lungs:  Clear with no wheezes, rales, or rhonchi. Decreased breath sounds bilaterally HEART:  Regular rate rhythm, no murmurs, no rubs, no clicks Abd:  soft, positive bowel sounds, no organomegally, no rebound, no guarding Ext:  2 plus pulses, no edema, no cyanosis, no clubbing Skin:  No rashes no nodules Neuro:  CN II through XII intact, motor grossly intact  DEVICE  Normal device function.  See PaceArt for details.   Assess/Plan:

## 2013-10-04 NOTE — Patient Instructions (Signed)
Your physician wants you to follow-up in:  ONE YEAR You will receive a reminder letter in the mail two months in advance. If you don't receive a letter, please call our office to schedule the follow-up appointment.  CARELINK SCHEDULED 01-06-2014

## 2013-10-04 NOTE — Assessment & Plan Note (Signed)
His Medtronic Maximo single chamber defibrillator continues to work normally. His battery voltage is 2.75 V. We'll plan to see him back in one year. We discussed the possibility of not replacing his device. He is reflecting.

## 2013-10-04 NOTE — Assessment & Plan Note (Signed)
Over the last of his device, he has had no recurrent ventricular arrhythmias. No change in medical therapy.

## 2014-01-06 ENCOUNTER — Encounter: Payer: Medicare Other | Admitting: *Deleted

## 2014-01-16 ENCOUNTER — Encounter: Payer: Self-pay | Admitting: *Deleted

## 2014-04-02 ENCOUNTER — Encounter: Payer: Self-pay | Admitting: Cardiology

## 2014-08-08 ENCOUNTER — Encounter: Payer: Self-pay | Admitting: Cardiology

## 2014-12-22 ENCOUNTER — Other Ambulatory Visit: Payer: Self-pay | Admitting: Neurology

## 2014-12-22 DIAGNOSIS — I739 Peripheral vascular disease, unspecified: Secondary | ICD-10-CM

## 2014-12-24 ENCOUNTER — Ambulatory Visit (HOSPITAL_COMMUNITY): Admission: RE | Admit: 2014-12-24 | Payer: Medicare Other | Source: Ambulatory Visit

## 2015-01-06 ENCOUNTER — Encounter: Payer: Self-pay | Admitting: *Deleted

## 2015-02-16 ENCOUNTER — Encounter: Payer: Self-pay | Admitting: Internal Medicine

## 2015-02-16 ENCOUNTER — Ambulatory Visit (INDEPENDENT_AMBULATORY_CARE_PROVIDER_SITE_OTHER): Payer: Medicare Other | Admitting: Internal Medicine

## 2015-02-16 ENCOUNTER — Encounter: Payer: Medicare Other | Admitting: Internal Medicine

## 2015-02-16 DIAGNOSIS — I4729 Other ventricular tachycardia: Secondary | ICD-10-CM

## 2015-02-16 DIAGNOSIS — I472 Ventricular tachycardia: Secondary | ICD-10-CM | POA: Diagnosis not present

## 2015-02-16 LAB — MDC_IDC_ENUM_SESS_TYPE_INCLINIC
Battery Voltage: 2.62 V
Brady Statistic RV Percent Paced: 0 %
HIGH POWER IMPEDANCE MEASURED VALUE: 48 Ohm
HIGH POWER IMPEDANCE MEASURED VALUE: 50 Ohm
HIGH POWER IMPEDANCE MEASURED VALUE: 51 Ohm
HIGH POWER IMPEDANCE MEASURED VALUE: 60 Ohm
HIGH POWER IMPEDANCE MEASURED VALUE: 62 Ohm
HIGH POWER IMPEDANCE MEASURED VALUE: 64 Ohm
HIGH POWER IMPEDANCE MEASURED VALUE: 72 Ohm
HIGH POWER IMPEDANCE MEASURED VALUE: 72 Ohm
HIGH POWER IMPEDANCE MEASURED VALUE: 72 Ohm
HIGH POWER IMPEDANCE MEASURED VALUE: 73 Ohm
HIGH POWER IMPEDANCE MEASURED VALUE: 75 Ohm
HighPow Impedance: 46 Ohm
HighPow Impedance: 46 Ohm
HighPow Impedance: 47 Ohm
HighPow Impedance: 49 Ohm
HighPow Impedance: 49 Ohm
HighPow Impedance: 50 Ohm
HighPow Impedance: 50 Ohm
HighPow Impedance: 50 Ohm
HighPow Impedance: 50 Ohm
HighPow Impedance: 50 Ohm
HighPow Impedance: 51 Ohm
HighPow Impedance: 52 Ohm
HighPow Impedance: 54 Ohm
HighPow Impedance: 67 Ohm
HighPow Impedance: 69 Ohm
HighPow Impedance: 70 Ohm
HighPow Impedance: 71 Ohm
HighPow Impedance: 72 Ohm
HighPow Impedance: 76 Ohm
HighPow Impedance: 79 Ohm
Lead Channel Impedance Value: 520 Ohm
Lead Channel Impedance Value: 520 Ohm
Lead Channel Impedance Value: 520 Ohm
Lead Channel Impedance Value: 520 Ohm
Lead Channel Impedance Value: 520 Ohm
Lead Channel Impedance Value: 520 Ohm
Lead Channel Impedance Value: 528 Ohm
Lead Channel Impedance Value: 536 Ohm
Lead Channel Impedance Value: 544 Ohm
Lead Channel Impedance Value: 544 Ohm
Lead Channel Pacing Threshold Amplitude: 2 V
Lead Channel Pacing Threshold Pulse Width: 0.8 ms
Lead Channel Sensing Intrinsic Amplitude: 2.8 mV
Lead Channel Sensing Intrinsic Amplitude: 3.2 mV
Lead Channel Sensing Intrinsic Amplitude: 3.3 mV
Lead Channel Sensing Intrinsic Amplitude: 3.3 mV
Lead Channel Sensing Intrinsic Amplitude: 3.4 mV
Lead Channel Sensing Intrinsic Amplitude: 3.5 mV
Lead Channel Sensing Intrinsic Amplitude: 3.6 mV
Lead Channel Sensing Intrinsic Amplitude: 3.6 mV
Lead Channel Sensing Intrinsic Amplitude: 3.9 mV
Lead Channel Setting Pacing Pulse Width: 0.9 ms
MDC IDC MSMT LEADCHNL RV IMPEDANCE VALUE: 512 Ohm
MDC IDC MSMT LEADCHNL RV IMPEDANCE VALUE: 528 Ohm
MDC IDC MSMT LEADCHNL RV IMPEDANCE VALUE: 536 Ohm
MDC IDC MSMT LEADCHNL RV IMPEDANCE VALUE: 544 Ohm
MDC IDC MSMT LEADCHNL RV IMPEDANCE VALUE: 544 Ohm
MDC IDC MSMT LEADCHNL RV SENSING INTR AMPL: 3 mV
MDC IDC MSMT LEADCHNL RV SENSING INTR AMPL: 3.1 mV
MDC IDC MSMT LEADCHNL RV SENSING INTR AMPL: 3.2 mV
MDC IDC MSMT LEADCHNL RV SENSING INTR AMPL: 3.3 mV
MDC IDC MSMT LEADCHNL RV SENSING INTR AMPL: 3.4 mV
MDC IDC MSMT LEADCHNL RV SENSING INTR AMPL: 3.6 mV
MDC IDC SESS DTM: 20160418085223
MDC IDC SET LEADCHNL RV PACING AMPLITUDE: 4 V
MDC IDC SET LEADCHNL RV SENSING SENSITIVITY: 0.3 mV
Zone Setting Detection Interval: 220 ms
Zone Setting Detection Interval: 300 ms
Zone Setting Detection Interval: 350 ms

## 2015-02-16 NOTE — Progress Notes (Signed)
HPI [name] returns today for followup. He is a very pleasant middle-age man with an ischemic cardiomyopathy, status post MI, with chronic systolic heart failure, status post ICD implantation. In the interim, he has done well. He denies chest pain or shortness of breath. No syncope. He continues to smoke cigarettes at over a pack per day. He has mild claudication in his lower extremities. He has reached ERI on his ICD. He is not certain he wants another device.  Allergies  Allergen Reactions  . Statins      Current Outpatient Prescriptions  Medication Sig Dispense Refill  . Albuterol Sulfate (PROAIR HFA IN) Inhale into the lungs.    . ALPRAZolam (XANAX) 1 MG tablet     . aspirin 81 MG tablet Take 81 mg by mouth daily.    . carvedilol (COREG) 25 MG tablet Take 25 mg by mouth 2 (two) times daily.    . Colchicine (COLCRYS PO) Take by mouth.    . cyclobenzaprine (FLEXERIL) 10 MG tablet Take by mouth.     . diazepam (VALIUM) 5 MG tablet Take 5 mg by mouth.     . Fluticasone-Salmeterol (ADVAIR HFA IN) Inhale into the lungs.    . gabapentin (NEURONTIN) 300 MG capsule Take 400 mg by mouth 2 (two) times daily.     Marland Kitchen. glipiZIDE (GLUCOTROL XL) 10 MG 24 hr tablet Take 10 mg by mouth 2 (two) times daily.     Marland Kitchen. HYDROmorphone (DILAUDID) 4 MG tablet Take 4 mg by mouth.     . metFORMIN (GLUCOPHAGE-XR) 500 MG 24 hr tablet Take 500 mg by mouth 4 (four) times daily.     Marland Kitchen. omeprazole (PRILOSEC) 40 MG capsule Take 40 mg by mouth daily.     . promethazine (PHENERGAN) 25 MG tablet     . ramipril (ALTACE) 10 MG capsule Take 10 mg by mouth daily.     . Tiotropium Bromide Monohydrate (SPIRIVA HANDIHALER IN) Inhale into the lungs.    Marland Kitchen. ZETIA 10 MG tablet Take 10 mg by mouth daily.      No current facility-administered medications for this visit.     Past Medical History  Diagnosis Date  . Heart attack   . Low back pain   . Diabetic neuropathy   . Diabetes type 2, controlled   . Gout   . Hypertension    . Erectile dysfunction   . CAD (coronary artery disease)   . Hypercholesteremia   . Anemia     ROS:   All systems reviewed and negative except as noted in the HPI.   Past Surgical History  Procedure Laterality Date  . Coronary angioplasty with stent placement    . Insert / replace / remove pacemaker       No family history on file.   History   Social History  . Marital Status: Legally Separated    Spouse Name: N/A  . Number of Children: N/A  . Years of Education: N/A   Occupational History  . Not on file.   Social History Main Topics  . Smoking status: Current Every Day Smoker  . Smokeless tobacco: Not on file  . Alcohol Use: Not on file  . Drug Use: No  . Sexual Activity: Not on file   Other Topics Concern  . Not on file   Social History Narrative     BP 178/60 mmHg  Pulse 60  Ht 5\' 10"  (1.778 m)  Wt 174 lb (78.926 kg)  BMI 24.97  kg/m2  SpO2 98%  Physical Exam:  Well appearing middle-aged man, NAD HEENT: Unremarkable Neck:  No JVD, no thyromegally Lungs:  Clear with no wheezes, rales, or rhonchi. Decreased breath sounds bilaterally HEART:  Regular rate rhythm, no murmurs, no rubs, no clicks Abd:  soft, positive bowel sounds, no organomegally, no rebound, no guarding Ext:  2 plus pulses, no edema, no cyanosis, no clubbing Skin:  No rashes no nodules Neuro:  CN II through XII intact, motor grossly intact  DEVICE  Normal device function.  See PaceArt for details. ERI  Assess/Plan:

## 2015-02-16 NOTE — Assessment & Plan Note (Signed)
He denies anginal symptoms. He will continue his current meds. He continues to smoke cigarettes. He is encouraged to stop.

## 2015-02-16 NOTE — Assessment & Plan Note (Signed)
He will continue his bronchodilators. Smoking cessation is encouraged.

## 2015-02-16 NOTE — Assessment & Plan Note (Signed)
He has reached ERI. I have discussed the indications for a new device. He is not sure he wants one. I will ask him to undergo 2D echo to evaluate his LV function.

## 2015-02-16 NOTE — Assessment & Plan Note (Signed)
His blood pressure is high today. He has not taken his morning meds and is encouraged to do so. He is encouraged to reduce his sodium intake.

## 2015-02-16 NOTE — Addendum Note (Signed)
Addended by: Kerney ElbePINNIX, Jazzman Loughmiller G on: 02/16/2015 04:27 PM   Modules accepted: Orders

## 2015-02-16 NOTE — Assessment & Plan Note (Signed)
I cannot find any additional information about any VT. He has not ever received an ICD shock.

## 2015-02-16 NOTE — Patient Instructions (Signed)
Your physician recommends that you schedule a follow-up appointment in: After ECHO  Your physician recommends that you continue on your current medications as directed. Please refer to the Current Medication list given to you today.  Your physician has requested that you have an echocardiogram. Echocardiography is a painless test that uses sound waves to create images of your heart. It provides your doctor with information about the size and shape of your heart and how well your heart's chambers and valves are working. This procedure takes approximately one hour. There are no restrictions for this procedure.  Thank you for choosing [facility]!

## 2015-02-23 ENCOUNTER — Ambulatory Visit (HOSPITAL_COMMUNITY)
Admission: RE | Admit: 2015-02-23 | Discharge: 2015-02-23 | Disposition: A | Discharge status: Home / Self Care | Payer: Medicare Other | Source: Ambulatory Visit | Attending: Internal Medicine | Admitting: Internal Medicine

## 2015-02-23 DIAGNOSIS — I472 Ventricular tachycardia: Secondary | ICD-10-CM

## 2015-02-23 DIAGNOSIS — I471 Supraventricular tachycardia: Secondary | ICD-10-CM

## 2015-02-23 DIAGNOSIS — Z720 Tobacco use: Secondary | ICD-10-CM | POA: Insufficient documentation

## 2015-02-23 DIAGNOSIS — I10 Essential (primary) hypertension: Secondary | ICD-10-CM | POA: Diagnosis not present

## 2015-02-23 DIAGNOSIS — E119 Type 2 diabetes mellitus without complications: Secondary | ICD-10-CM | POA: Insufficient documentation

## 2015-02-23 DIAGNOSIS — I4729 Other ventricular tachycardia: Secondary | ICD-10-CM

## 2015-02-23 NOTE — Progress Notes (Signed)
*  PRELIMINARY RESULTS* Echocardiogram 2D Echocardiogram has been performed.  [name] [name], [name] [name] 02/23/2015, 12:32 PM

## 2015-03-09 ENCOUNTER — Emergency Department (HOSPITAL_COMMUNITY): Payer: Medicare Other

## 2015-03-09 ENCOUNTER — Encounter (HOSPITAL_COMMUNITY): Payer: Self-pay | Admitting: Emergency Medicine

## 2015-03-09 ENCOUNTER — Encounter (HOSPITAL_COMMUNITY)
Admission: EM | Disposition: A | Discharge status: Home / Self Care | Payer: Self-pay | Source: Home / Self Care | Attending: Internal Medicine

## 2015-03-09 ENCOUNTER — Inpatient Hospital Stay (HOSPITAL_COMMUNITY)
Admission: EM | Admit: 2015-03-09 | Discharge: 2015-03-10 | DRG: 377 | Disposition: A | Discharge status: Home / Self Care | Payer: Medicare Other | Attending: Internal Medicine | Admitting: Internal Medicine

## 2015-03-09 DIAGNOSIS — Z720 Tobacco use: Secondary | ICD-10-CM

## 2015-03-09 DIAGNOSIS — E780 Pure hypercholesterolemia: Secondary | ICD-10-CM | POA: Diagnosis present

## 2015-03-09 DIAGNOSIS — R042 Hemoptysis: Secondary | ICD-10-CM | POA: Diagnosis not present

## 2015-03-09 DIAGNOSIS — Z955 Presence of coronary angioplasty implant and graft: Secondary | ICD-10-CM

## 2015-03-09 DIAGNOSIS — K222 Esophageal obstruction: Secondary | ICD-10-CM | POA: Insufficient documentation

## 2015-03-09 DIAGNOSIS — I252 Old myocardial infarction: Secondary | ICD-10-CM | POA: Diagnosis not present

## 2015-03-09 DIAGNOSIS — Z95810 Presence of automatic (implantable) cardiac defibrillator: Secondary | ICD-10-CM | POA: Diagnosis not present

## 2015-03-09 DIAGNOSIS — Q394 Esophageal web: Secondary | ICD-10-CM | POA: Diagnosis not present

## 2015-03-09 DIAGNOSIS — Z7982 Long term (current) use of aspirin: Secondary | ICD-10-CM | POA: Diagnosis not present

## 2015-03-09 DIAGNOSIS — D649 Anemia, unspecified: Secondary | ICD-10-CM

## 2015-03-09 DIAGNOSIS — R9389 Abnormal findings on diagnostic imaging of other specified body structures: Secondary | ICD-10-CM

## 2015-03-09 DIAGNOSIS — I10 Essential (primary) hypertension: Secondary | ICD-10-CM | POA: Diagnosis present

## 2015-03-09 DIAGNOSIS — K449 Diaphragmatic hernia without obstruction or gangrene: Secondary | ICD-10-CM | POA: Diagnosis present

## 2015-03-09 DIAGNOSIS — R110 Nausea: Secondary | ICD-10-CM | POA: Diagnosis not present

## 2015-03-09 DIAGNOSIS — K920 Hematemesis: Secondary | ICD-10-CM | POA: Diagnosis present

## 2015-03-09 DIAGNOSIS — J449 Chronic obstructive pulmonary disease, unspecified: Secondary | ICD-10-CM | POA: Diagnosis present

## 2015-03-09 DIAGNOSIS — E1140 Type 2 diabetes mellitus with diabetic neuropathy, unspecified: Secondary | ICD-10-CM | POA: Diagnosis present

## 2015-03-09 DIAGNOSIS — F17200 Nicotine dependence, unspecified, uncomplicated: Secondary | ICD-10-CM

## 2015-03-09 DIAGNOSIS — I2510 Atherosclerotic heart disease of native coronary artery without angina pectoris: Secondary | ICD-10-CM | POA: Diagnosis present

## 2015-03-09 DIAGNOSIS — K429 Umbilical hernia without obstruction or gangrene: Secondary | ICD-10-CM | POA: Diagnosis present

## 2015-03-09 DIAGNOSIS — E119 Type 2 diabetes mellitus without complications: Secondary | ICD-10-CM

## 2015-03-09 DIAGNOSIS — I255 Ischemic cardiomyopathy: Secondary | ICD-10-CM | POA: Diagnosis present

## 2015-03-09 DIAGNOSIS — F17210 Nicotine dependence, cigarettes, uncomplicated: Secondary | ICD-10-CM | POA: Diagnosis present

## 2015-03-09 DIAGNOSIS — I5022 Chronic systolic (congestive) heart failure: Secondary | ICD-10-CM | POA: Diagnosis present

## 2015-03-09 DIAGNOSIS — M109 Gout, unspecified: Secondary | ICD-10-CM | POA: Diagnosis present

## 2015-03-09 DIAGNOSIS — J189 Pneumonia, unspecified organism: Secondary | ICD-10-CM

## 2015-03-09 HISTORY — PX: MALONEY DILATION: SHX5535

## 2015-03-09 HISTORY — DX: Ventricular tachycardia: I47.2

## 2015-03-09 HISTORY — DX: Ischemic cardiomyopathy: I25.5

## 2015-03-09 HISTORY — PX: ESOPHAGOGASTRODUODENOSCOPY: SHX5428

## 2015-03-09 HISTORY — DX: Other ventricular tachycardia: I47.29

## 2015-03-09 HISTORY — DX: Chronic systolic (congestive) heart failure: I50.22

## 2015-03-09 LAB — CBC WITH DIFFERENTIAL/PLATELET
Basophils Absolute: 0 10*3/uL (ref 0.0–0.1)
Basophils Relative: 0 % (ref 0–1)
EOS PCT: 0 % (ref 0–5)
Eosinophils Absolute: 0 10*3/uL (ref 0.0–0.7)
HEMATOCRIT: 25.1 % — AB (ref 39.0–52.0)
HEMOGLOBIN: 8.2 g/dL — AB (ref 13.0–17.0)
LYMPHS ABS: 2.1 10*3/uL (ref 0.7–4.0)
Lymphocytes Relative: 16 % (ref 12–46)
MCH: 25.7 pg — ABNORMAL LOW (ref 26.0–34.0)
MCHC: 32.7 g/dL (ref 30.0–36.0)
MCV: 78.7 fL (ref 78.0–100.0)
MONO ABS: 0.6 10*3/uL (ref 0.1–1.0)
Monocytes Relative: 5 % (ref 3–12)
Neutro Abs: 10.5 10*3/uL — ABNORMAL HIGH (ref 1.7–7.7)
Neutrophils Relative %: 79 % — ABNORMAL HIGH (ref 43–77)
PLATELETS: 240 10*3/uL (ref 150–400)
RBC: 3.19 MIL/uL — ABNORMAL LOW (ref 4.22–5.81)
RDW: 16.4 % — ABNORMAL HIGH (ref 11.5–15.5)
WBC: 13.3 10*3/uL — AB (ref 4.0–10.5)

## 2015-03-09 LAB — CBC
HCT: 22.7 % — ABNORMAL LOW (ref 39.0–52.0)
HEMATOCRIT: 23.9 % — AB (ref 39.0–52.0)
HEMOGLOBIN: 7.5 g/dL — AB (ref 13.0–17.0)
Hemoglobin: 7.8 g/dL — ABNORMAL LOW (ref 13.0–17.0)
MCH: 26 pg (ref 26.0–34.0)
MCH: 26.2 pg (ref 26.0–34.0)
MCHC: 33 g/dL (ref 30.0–36.0)
MCHC: 32.6 g/dL (ref 30.0–36.0)
MCV: 78.5 fL (ref 78.0–100.0)
MCV: 80.2 fL (ref 78.0–100.0)
Platelets: 190 10*3/uL (ref 150–400)
Platelets: 164 10*3/uL (ref 150–400)
RBC: 2.89 MIL/uL — ABNORMAL LOW (ref 4.22–5.81)
RBC: 2.98 MIL/uL — ABNORMAL LOW (ref 4.22–5.81)
RDW: 16.4 % — ABNORMAL HIGH (ref 11.5–15.5)
RDW: 16.7 % — ABNORMAL HIGH (ref 11.5–15.5)
WBC: 9.4 10*3/uL (ref 4.0–10.5)
WBC: 7.4 10*3/uL (ref 4.0–10.5)

## 2015-03-09 LAB — PREPARE RBC (CROSSMATCH)

## 2015-03-09 LAB — OCCULT BLOOD, POC DEVICE: FECAL OCCULT BLD: POSITIVE — AB

## 2015-03-09 LAB — GLUCOSE, CAPILLARY
GLUCOSE-CAPILLARY: 99 mg/dL (ref 70–99)
Glucose-Capillary: 86 mg/dL (ref 70–99)
Glucose-Capillary: 100 mg/dL — ABNORMAL HIGH (ref 70–99)
Glucose-Capillary: 118 mg/dL — ABNORMAL HIGH (ref 70–99)
Glucose-Capillary: 269 mg/dL — ABNORMAL HIGH (ref 70–99)

## 2015-03-09 LAB — COMPREHENSIVE METABOLIC PANEL
ALBUMIN: 3.2 g/dL — AB (ref 3.5–5.0)
ALBUMIN: 3 g/dL — AB (ref 3.5–5.0)
ALK PHOS: 43 U/L (ref 38–126)
ALT: 14 U/L — AB (ref 17–63)
ALT: 12 U/L — AB (ref 17–63)
ANION GAP: 10 (ref 5–15)
AST: 14 U/L — AB (ref 15–41)
AST: 14 U/L — AB (ref 15–41)
Alkaline Phosphatase: 39 U/L (ref 38–126)
Anion gap: 9 (ref 5–15)
BUN: 33 mg/dL — ABNORMAL HIGH (ref 6–20)
BUN: 31 mg/dL — ABNORMAL HIGH (ref 6–20)
CALCIUM: 8.7 mg/dL — AB (ref 8.9–10.3)
CHLORIDE: 105 mmol/L (ref 101–111)
CO2: 23 mmol/L (ref 22–32)
CO2: 23 mmol/L (ref 22–32)
CREATININE: 1.38 mg/dL — AB (ref 0.61–1.24)
Calcium: 8.6 mg/dL — ABNORMAL LOW (ref 8.9–10.3)
Chloride: 99 mmol/L — ABNORMAL LOW (ref 101–111)
Creatinine, Ser: 1.2 mg/dL (ref 0.61–1.24)
GFR calc Af Amer: 60 mL/min — ABNORMAL LOW (ref >60)
GFR calc Af Amer: >60 mL/min (ref >60)
GFR calc non Af Amer: 52 mL/min — ABNORMAL LOW (ref >60)
GFR calc non Af Amer: >60 mL/min (ref >60)
Glucose, Bld: 214 mg/dL — ABNORMAL HIGH (ref 70–99)
Glucose, Bld: 87 mg/dL (ref 70–99)
Potassium: 4.6 mmol/L (ref 3.5–5.1)
Potassium: 4.2 mmol/L (ref 3.5–5.1)
SODIUM: 132 mmol/L — AB (ref 135–145)
Sodium: 137 mmol/L (ref 135–145)
TOTAL PROTEIN: 6 g/dL — AB (ref 6.5–8.1)
TOTAL PROTEIN: 5.6 g/dL — AB (ref 6.5–8.1)
Total Bilirubin: 0.4 mg/dL (ref 0.3–1.2)
Total Bilirubin: 0.4 mg/dL (ref 0.3–1.2)

## 2015-03-09 LAB — ABO/RH: ABO/RH(D): A POS

## 2015-03-09 LAB — PROTIME-INR
INR: 1.05 (ref 0.00–1.49)
Prothrombin Time: 13.8 seconds (ref 11.6–15.2)

## 2015-03-09 LAB — APTT: aPTT: 26 seconds (ref 24–37)

## 2015-03-09 LAB — STREP PNEUMONIAE URINARY ANTIGEN: STREP PNEUMO URINARY ANTIGEN: NEGATIVE

## 2015-03-09 SURGERY — EGD (ESOPHAGOGASTRODUODENOSCOPY)
Anesthesia: Moderate Sedation

## 2015-03-09 MED ORDER — SIMETHICONE 40 MG/0.6ML PO SUSP
ORAL | Status: DC | PRN
  Administered 2015-03-09: 17:00:00

## 2015-03-09 MED ORDER — TIOTROPIUM BROMIDE MONOHYDRATE 18 MCG IN CAPS
18.0000 ug | ORAL_CAPSULE | Freq: Every day | RESPIRATORY_TRACT | Status: DC
  Administered 2015-03-10: 18 ug via RESPIRATORY_TRACT
  Filled 2015-03-09: qty 5

## 2015-03-09 MED ORDER — ONDANSETRON HCL 4 MG/2ML IJ SOLN
4.0000 mg | Freq: Four times a day (QID) | INTRAMUSCULAR | Status: DC | PRN
  Administered 2015-03-09 – 2015-03-10 (×2): 4 mg via INTRAVENOUS
  Filled 2015-03-09 (×2): qty 2

## 2015-03-09 MED ORDER — SODIUM CHLORIDE 0.9 % IJ SOLN
INTRAMUSCULAR | Status: AC
  Filled 2015-03-09: qty 500

## 2015-03-09 MED ORDER — MEPERIDINE HCL 100 MG/ML IJ SOLN
INTRAMUSCULAR | Status: AC
  Filled 2015-03-09: qty 2

## 2015-03-09 MED ORDER — LIDOCAINE VISCOUS 2 % MT SOLN
OROMUCOSAL | Status: AC
  Filled 2015-03-09: qty 15

## 2015-03-09 MED ORDER — RAMIPRIL 5 MG PO CAPS
10.0000 mg | ORAL_CAPSULE | Freq: Every day | ORAL | Status: DC
  Administered 2015-03-10: 10 mg via ORAL
  Filled 2015-03-09 (×2): qty 2

## 2015-03-09 MED ORDER — COLCHICINE 0.6 MG PO TABS
0.6000 mg | ORAL_TABLET | Freq: Two times a day (BID) | ORAL | Status: DC
  Filled 2015-03-09 (×3): qty 1

## 2015-03-09 MED ORDER — SODIUM CHLORIDE 0.9 % IV SOLN
1000.0000 mL | INTRAVENOUS | Status: DC
  Administered 2015-03-09: 1000 mL via INTRAVENOUS

## 2015-03-09 MED ORDER — HYDROMORPHONE HCL 1 MG/ML IJ SOLN
1.0000 mg | INTRAMUSCULAR | Status: DC | PRN
  Administered 2015-03-09 – 2015-03-10 (×3): 1 mg via INTRAVENOUS
  Filled 2015-03-09 (×3): qty 1

## 2015-03-09 MED ORDER — MIDAZOLAM HCL 5 MG/5ML IJ SOLN
INTRAMUSCULAR | Status: DC | PRN
  Administered 2015-03-09: 1 mg via INTRAVENOUS
  Administered 2015-03-09: 2 mg via INTRAVENOUS

## 2015-03-09 MED ORDER — ONDANSETRON HCL 4 MG/2ML IJ SOLN
INTRAMUSCULAR | Status: AC
  Filled 2015-03-09: qty 2

## 2015-03-09 MED ORDER — ONDANSETRON HCL 4 MG PO TABS: 4.0000 mg | ORAL_TABLET | Freq: Four times a day (QID) | ORAL | Status: DC | PRN

## 2015-03-09 MED ORDER — PANTOPRAZOLE SODIUM 40 MG IV SOLR
40.0000 mg | Freq: Two times a day (BID) | INTRAVENOUS | Status: DC
  Administered 2015-03-09 – 2015-03-10 (×3): 40 mg via INTRAVENOUS
  Filled 2015-03-09 (×3): qty 40

## 2015-03-09 MED ORDER — GABAPENTIN 400 MG PO CAPS
400.0000 mg | ORAL_CAPSULE | Freq: Two times a day (BID) | ORAL | Status: DC
  Administered 2015-03-09 – 2015-03-10 (×3): 400 mg via ORAL
  Filled 2015-03-09 (×6): qty 1

## 2015-03-09 MED ORDER — IOHEXOL 300 MG/ML  SOLN
80.0000 mL | Freq: Once | INTRAMUSCULAR | Status: AC | PRN
  Administered 2015-03-09: 100 mL via INTRAVENOUS

## 2015-03-09 MED ORDER — INSULIN ASPART 100 UNIT/ML ~~LOC~~ SOLN
0.0000 [IU] | Freq: Three times a day (TID) | SUBCUTANEOUS | Status: DC
  Administered 2015-03-10: 2 [IU] via SUBCUTANEOUS
  Administered 2015-03-10: 3 [IU] via SUBCUTANEOUS

## 2015-03-09 MED ORDER — EZETIMIBE 10 MG PO TABS
10.0000 mg | ORAL_TABLET | Freq: Every day | ORAL | Status: DC
  Administered 2015-03-09 – 2015-03-10 (×2): 10 mg via ORAL
  Filled 2015-03-09 (×2): qty 1

## 2015-03-09 MED ORDER — LIDOCAINE VISCOUS 2 % MT SOLN
OROMUCOSAL | Status: DC | PRN
  Administered 2015-03-09: 4 mL via OROMUCOSAL

## 2015-03-09 MED ORDER — PROMETHAZINE HCL 25 MG/ML IJ SOLN
12.5000 mg | INTRAMUSCULAR | Status: AC
  Administered 2015-03-09: 12.5 mg via INTRAVENOUS
  Filled 2015-03-09: qty 1

## 2015-03-09 MED ORDER — ONDANSETRON HCL 4 MG/2ML IJ SOLN
INTRAMUSCULAR | Status: DC | PRN
  Administered 2015-03-09: 4 mg via INTRAVENOUS

## 2015-03-09 MED ORDER — LEVOFLOXACIN 750 MG PO TABS
750.0000 mg | ORAL_TABLET | Freq: Every day | ORAL | Status: DC
  Administered 2015-03-10: 750 mg via ORAL
  Filled 2015-03-09: qty 1

## 2015-03-09 MED ORDER — LEVOFLOXACIN IN D5W 750 MG/150ML IV SOLN
750.0000 mg | Freq: Once | INTRAVENOUS | Status: AC
  Administered 2015-03-09: 750 mg via INTRAVENOUS
  Filled 2015-03-09: qty 150

## 2015-03-09 MED ORDER — CARVEDILOL 12.5 MG PO TABS
25.0000 mg | ORAL_TABLET | Freq: Two times a day (BID) | ORAL | Status: DC
  Administered 2015-03-09 – 2015-03-10 (×2): 25 mg via ORAL
  Filled 2015-03-09 (×2): qty 2

## 2015-03-09 MED ORDER — SODIUM CHLORIDE 0.9 % IJ SOLN
INTRAMUSCULAR | Status: AC
Start: 2015-03-09 — End: 2015-03-09
  Filled 2015-03-09: qty 45

## 2015-03-09 MED ORDER — SODIUM CHLORIDE 0.9 % IV SOLN
INTRAVENOUS | Status: DC
  Administered 2015-03-09: 05:00:00 via INTRAVENOUS

## 2015-03-09 MED ORDER — MIDAZOLAM HCL 5 MG/5ML IJ SOLN
INTRAMUSCULAR | Status: AC
  Filled 2015-03-09: qty 10

## 2015-03-09 MED ORDER — SODIUM CHLORIDE 0.9 % IV SOLN: INTRAVENOUS | Status: DC

## 2015-03-09 MED ORDER — SODIUM CHLORIDE 0.9 % IV SOLN
1000.0000 mL | Freq: Once | INTRAVENOUS | Status: AC
  Administered 2015-03-09: 1000 mL via INTRAVENOUS

## 2015-03-09 MED ORDER — MEPERIDINE HCL 100 MG/ML IJ SOLN
INTRAMUSCULAR | Status: DC | PRN
  Administered 2015-03-09: 50 mg via INTRAVENOUS

## 2015-03-09 NOTE — Progress Notes (Signed)
ANTIBIOTIC CONSULT NOTE-Preliminary  Pharmacy Consult for Levofloxacin Indication: Pneumonia  Allergies  Allergen Reactions  . Statins     Patient Measurements: Height: 5\' 10"  (177.8 cm) Weight: 170 lb (77.111 kg) IBW/kg (Calculated) : 73   Vital Signs: Temp: 98.5 F (36.9 C) (05/09 0437) Temp Source: Oral (05/09 0437) BP: 153/57 mmHg (05/09 0437) Pulse Rate: 69 (05/09 0437)  Labs:  Recent Labs  03/09/15 0135  WBC 13.3*  HGB 8.2*  PLT 240  CREATININE 1.38*    Estimated Creatinine Clearance: 54.4 mL/min (by C-G formula based on Cr of 1.38).  No results for input(s): VANCOTROUGH, VANCOPEAK, VANCORANDOM, GENTTROUGH, GENTPEAK, GENTRANDOM, TOBRATROUGH, TOBRAPEAK, TOBRARND, AMIKACINPEAK, AMIKACINTROU, AMIKACIN in the last 72 hours.   Microbiology: No results found for this or any previous visit (from the past 720 hour(s)).  Medical History: Past Medical History  Diagnosis Date  . Heart attack   . Low back pain   . Diabetic neuropathy   . Diabetes type 2, controlled   . Gout   . Hypertension   . Erectile dysfunction   . CAD (coronary artery disease)   . Hypercholesteremia   . Anemia     Medications:   Assessment: 64 yo male admitted with elevated WBCs and chest x-ray evidence of possible pneumonia. Levofloxacin for CAP  Goal of Therapy:  Eradicate infection  Plan:  Preliminary review of pertinent patient information completed.  Protocol will be initiated with a one-time dose of 750 mg IV levofloxacin.  Jeani HawkingAnnie Penn clinical pharmacist will complete review during morning rounds to assess patient and finalize treatment regimen.  Arelia SneddonMason, Grisell Bissette Anne, [facility] HospitalRPH 03/09/2015,4:53 AM

## 2015-03-09 NOTE — Progress Notes (Signed)
UR chart review completed.  

## 2015-03-09 NOTE — Op Note (Signed)
[facility] [address] [place], [id number]   ENDOSCOPY PROCEDURE REPORT  PATIENT: [name] [name], [name] [name]  MR#: [id number] BIRTHDATE: Mar 08, 1951 , 64  yrs. old GENDER: male ENDOSCOPIST: R.  Roetta SessionsMichael Rob Mciver, MD FACP FACG REFERRED BY:  Fara ChutePaul Sasser, M.D. PROCEDURE DATE:  03/09/2015 PROCEDURE:  EGD, diagnostic and Maloney dilation of esophagus INDICATIONS:  Possible hematemesis; recurrent esophageal dysphagia.  MEDICATIONS: Versed 3 mg IV and Demerol 50 mg IV.  Zofran 4 mg IV. Xylocaine gel orally. ASA CLASS:      Class III  CONSENT: The risks, benefits, limitations, alternatives and imponderables have been discussed.  The potential for biopsy, esophogeal dilation, etc. have also been reviewed.  Questions have been answered.  All parties agreeable.  Please see the history and physical in the medical record for more information.  DESCRIPTION OF PROCEDURE: After the risks benefits and alternatives of the procedure were thoroughly explained, informed consent was obtained.  The EG-[id number] ([id number] endoscope was introduced through the mouth and advanced to the second portion of the duodenum , limited by Without limitations. The instrument was slowly withdrawn as the mucosa was fully examined.    Noncritical schatzki's ring; otherwise normal-appearing esophagus. Stomach empty Small hiatal hernia.  Normal gastric mucosa.  Patent pylorus.  Normal-appearing first and second portion of the duodenum.  Scope was withdrawn.  A 56 French Maloney dilator was passed to full insertion easily.  A look back revealed the ring was ruptured nicely with minimal bleeding and without apparent complication. Retroflexed views revealed a hiatal hernia.     The scope was then withdrawn from the patient and the procedure completed.  COMPLICATIONS: There were no immediate complications.  ENDOSCOPIC IMPRESSION: Schatzki's ring?"status post esophageal dilation as described above. Small hiatal  hernia; otherwise normal EGD. I doubt significant, if any, upper GI bleeding recently.  RECOMMENDATIONS: Consider pulmonary and/or ENT etiologies for blood loss as deemed appropriate per attending. Continue PPI therapy.  We'll set the patient up for a colonoscopy in the outpatient setting.  REPEAT EXAM:  eSigned:  R. Roetta SessionsMichael Lisa Blakeman, MD Jerrel IvoryFACP [facility] HospitalFACG 03/09/2015 4:52 PM    CC:  CPT CODES: ICD CODES:  The ICD and CPT codes recommended by this software are interpretations from the data that the clinical staff has captured with the software.  The verification of the translation of this report to the ICD and CPT codes and modifiers is the sole responsibility of the health care institution and practicing physician where this report was generated.  [employer] will not be held responsible for the validity of the ICD and CPT codes included on this report.  AMA assumes no liability for data contained or not contained herein. CPT is a Publishing rights managerregistered trademark of the [employer].

## 2015-03-09 NOTE — H&P (Signed)
PCP:   Estanislado PandySASSER,PAUL W, MD   Chief Complaint:  Coughing up blood  HPI:  64 year old male who  has a past medical history of Heart attack; Low back pain; Diabetic neuropathy; Diabetes type 2, controlled; Gout; Hypertension; Erectile dysfunction; CAD (coronary artery disease); Hypercholesteremia; and Anemia. CAD status post stents, Patient says that 2 weeks ago he had regular appointment with his PCP at that time he had gout and was unable to move the right wrist. At that time he received steroid injection and a week later he was seen and was given another steroid injection and started on oral prednisone. Patient says that he had chronic abdominal pain for that he was started on omeprazole in the past and that had resolved the pain. After patient was started on prednisone the abdominal pain returned. Patient's is on Thursday he started having vomiting and also coughing up bright red blood. He had repeat episode on Saturday and tonight before he came to the hospital. He denies any black colored stools. In the ED bedside stool for occult blood was positive. Chest x-ray showed pneumonia. Patient also found to have anemia with-year-old 8.2, 1 unit of PRBC has in ordered. He denies chest pain, does get short of breath on exertion. He denies fever, no dysuria urgency or frequency of urination.  Allergies:   Allergies  Allergen Reactions  . Statins       Past Medical History  Diagnosis Date  . Heart attack   . Low back pain   . Diabetic neuropathy   . Diabetes type 2, controlled   . Gout   . Hypertension   . Erectile dysfunction   . CAD (coronary artery disease)   . Hypercholesteremia   . Anemia     Past Surgical History  Procedure Laterality Date  . Coronary angioplasty with stent placement    . Insert / replace / remove pacemaker      Prior to Admission medications   Medication Sig Start Date End Date Taking? Authorizing Provider  Albuterol Sulfate (PROAIR HFA IN) Inhale into  the lungs.    Historical Provider, MD  ALPRAZolam Prudy Feeler(XANAX) 1 MG tablet  09/12/13   Historical Provider, MD  aspirin 81 MG tablet Take 81 mg by mouth daily.    Historical Provider, MD  carvedilol (COREG) 25 MG tablet Take 25 mg by mouth 2 (two) times daily.    Historical Provider, MD  Colchicine (COLCRYS PO) Take by mouth.    Historical Provider, MD  cyclobenzaprine (FLEXERIL) 10 MG tablet Take by mouth.  08/19/12   Historical Provider, MD  diazepam (VALIUM) 5 MG tablet Take 5 mg by mouth.  08/17/12   Historical Provider, MD  Fluticasone-Salmeterol (ADVAIR HFA IN) Inhale into the lungs.    Historical Provider, MD  gabapentin (NEURONTIN) 300 MG capsule Take 400 mg by mouth 2 (two) times daily.  08/17/12   Historical Provider, MD  glipiZIDE (GLUCOTROL XL) 10 MG 24 hr tablet Take 10 mg by mouth 2 (two) times daily.  08/21/12   Historical Provider, MD  HYDROmorphone (DILAUDID) 4 MG tablet Take 4 mg by mouth.  08/22/12   Historical Provider, MD  metFORMIN (GLUCOPHAGE-XR) 500 MG 24 hr tablet Take 500 mg by mouth 4 (four) times daily.  08/21/12   Historical Provider, MD  omeprazole (PRILOSEC) 40 MG capsule Take 40 mg by mouth daily.  08/21/12   Historical Provider, MD  promethazine (PHENERGAN) 25 MG tablet  09/12/13   Historical Provider, MD  ramipril (ALTACE)  10 MG capsule Take 10 mg by mouth daily.  08/21/12   Historical Provider, MD  Tiotropium Bromide Monohydrate (SPIRIVA HANDIHALER IN) Inhale into the lungs.    Historical Provider, MD  ZETIA 10 MG tablet Take 10 mg by mouth daily.  06/24/12   Historical Provider, MD    Social History:  reports that he has been smoking.  He does not have any smokeless tobacco history on file. He reports that he does not drink alcohol or use illicit drugs.  No family history of cancer or heart disease  All the positives are listed in BOLD  Review of Systems:  HEENT: Headache, blurred vision, runny nose, sore throat Neck: Hypothyroidism,  hyperthyroidism,,lymphadenopathy Chest : Shortness of breath, history of COPD, Asthma Heart : Chest pain, history of coronary arterey disease GI:  Nausea, vomiting, diarrhea, constipation, GERD GU: Dysuria, urgency, frequency of urination, hematuria Neuro: Stroke, seizures, syncope Psych: Depression, anxiety, hallucinations   Physical Exam: Blood pressure 132/46, pulse 80, temperature 97.8 F (36.6 C), temperature source Oral, resp. rate 16, height 5\' 10"  (1.778 m), weight 77.111 kg (170 lb), SpO2 96 %. Constitutional:   Patient is a well-developed and well-nourished male in no acute distress and cooperative with exam. Head: Normocephalic and atraumatic Mouth: Mucus membranes moist Eyes: PERRL, EOMI, conjunctivae normal Neck: Supple, No Thyromegaly Cardiovascular: RRR, S1 normal, S2 normal Pulmonary/Chest: CTAB, no wheezes, rales, or rhonchi Abdominal: Soft. Non-tender, non-distended, bowel sounds are normal, no masses, organomegaly, or guarding present.  Neurological: A&O x3, Strength is normal and symmetric bilaterally, cranial nerve II-XII are grossly intact, no focal motor deficit, sensory intact to light touch bilaterally.  Extremities : No Cyanosis, Clubbing , bilateral 1+ pitting edema  Labs on Admission:  Basic Metabolic Panel:  Recent Labs Lab 03/09/15 0135  NA 132*  K 4.6  CL 99*  CO2 23  GLUCOSE 214*  BUN 33*  CREATININE 1.38*  CALCIUM 8.7*   Liver Function Tests:  Recent Labs Lab 03/09/15 0135  AST 14*  ALT 14*  ALKPHOS 43  BILITOT 0.4  PROT 6.0*  ALBUMIN 3.2*   No results for input(s): LIPASE, AMYLASE in the last 168 hours. No results for input(s): AMMONIA in the last 168 hours. CBC:  Recent Labs Lab 03/09/15 0135  WBC 13.3*  NEUTROABS 10.5*  HGB 8.2*  HCT 25.1*  MCV 78.7  PLT 240    Radiological Exams on Admission: Dg Chest Portable 1 View  03/09/2015   CLINICAL DATA:  Vomiting and hemoptysis for 4 days.  Dyspnea today.  EXAM: PORTABLE  CHEST - 1 VIEW  COMPARISON:  06/24/2010  FINDINGS: There is right upper lobe infiltrate. Question right hilar adenopathy. Left lung is clear. There are no large effusions.  IMPRESSION: Right upper lobe infiltrate, possibly pneumonia. If there is clinical evidence of pneumonia, follow-up PA and lateral chest X-ray is recommended in 3-4 weeks following trial of antibiotic therapy to ensure resolution and exclude underlying malignancy. If the patient does not have clinical pneumonia, recommend chest CT with contrast to exclude malignancy.   Electronically Signed   By: Ellery Plunkaniel R Mitchell M.D.   On: 03/09/2015 02:04     Assessment/Plan Active Problems:   Essential hypertension   Coronary atherosclerosis   Tobacco abuse   Pneumonia  Pneumonia Chest x-ray shows right upper lobe infiltrate with possible pneumonia. Also recommend repeat chest x-ray in 2-4 weeks following a course of antibiotic therapy. Also recommend CT chest to exclude malignancy. Will admit the patient to MedSurg unit,  start Levaquin per pharmacy. Follow blood cultures, also obtain urinary strep antigen as well as Legionella antigen.  ? Upper GI bleed/hematemesis Patient complains of hematemesis, 3 episodes was started off the patient was put on steroids for gout attack. Will get GI consultation. We'll start Protonix 40 mg IV every 12 hours  Diabetes mellitus Hold oral hypoglycemic agents, start sliding scale insulin with NovoLog.  Anemia Hemoglobin is 8.2, will transfuse 1 unit of PRBC.  Code status: Full code  Family discussion: Admission, patients condition and plan of care including tests being ordered have been discussed with the patient and girlfriend at bedside* who indicate understanding and agree with the plan and Code Status.   Time Spent on Admission: 60 min  Crystal Ellwood S [facility] Pager: [phone] 03/09/2015, 4:01 AM  If 7PM-7AM, please contact night-coverage  [email or web address]  Password [email or web address]

## 2015-03-09 NOTE — Progress Notes (Signed)
ANTIBIOTIC CONSULT NOTE  Pharmacy Consult for Levaquin Indication: pneumonia  Allergies  Allergen Reactions  . Statins     Patient Measurements: Height: 5\' 10"  (177.8 cm) Weight: 170 lb (77.111 kg) IBW/kg (Calculated) : 73  Vital Signs: Temp: 98.7 F (37.1 C) (05/09 0700) Temp Source: Oral (05/09 0700) BP: 122/46 mmHg (05/09 0700) Pulse Rate: 61 (05/09 0700) Intake/Output from previous day: 05/08 0701 - 05/09 0700 In: 335 [Blood:335] Out: -  Intake/Output from this shift:    Labs:  Recent Labs  03/09/15 0135 03/09/15 0436  WBC 13.3* 9.4  HGB 8.2* 7.5*  PLT 240 190  CREATININE 1.38* 1.20   Estimated Creatinine Clearance: 62.5 mL/min (by C-G formula based on Cr of 1.2). No results for input(s): VANCOTROUGH, VANCOPEAK, VANCORANDOM, GENTTROUGH, GENTPEAK, GENTRANDOM, TOBRATROUGH, TOBRAPEAK, TOBRARND, AMIKACINPEAK, AMIKACINTROU, AMIKACIN in the last 72 hours.   Microbiology: Recent Results (from the past 720 hour(s))  Culture, blood (routine x 2)     Status: None (Preliminary result)   Collection Time: 03/09/15  4:36 AM  Result Value Ref Range Status   Specimen Description RIGHT ANTECUBITAL  Final   Special Requests BOTTLES DRAWN AEROBIC AND ANAEROBIC 6CC  Final   Culture PENDING  Incomplete   Report Status PENDING  Incomplete  Culture, blood (routine x 2)     Status: None (Preliminary result)   Collection Time: 03/09/15  4:40 AM  Result Value Ref Range Status   Specimen Description BLOOD RIGHT HAND  Final   Special Requests BOTTLES DRAWN AEROBIC AND ANAEROBIC 6CC  Final   Culture PENDING  Incomplete   Report Status PENDING  Incomplete    Anti-infectives    Start     Dose/Rate Route Frequency Ordered Stop   03/09/15 0500  levofloxacin (LEVAQUIN) IVPB 750 mg     750 mg 100 mL/hr over 90 Minutes Intravenous  Once 03/09/15 0452 03/09/15 0631      Assessment: 64 yo M who presented with hematemesis was subsequently found to have CXR + PNA.  He was started on  Levaquin in ED.   He is afebrile with normal WBC and no respiratory complaints.  Renal function is improved.  CrCl >1350ml/min.   Goal of Therapy:  Eradicate infection.  Plan:  Levaquin 750mg  po daily Duration of therapy per MD- recommend 5-7 days No further dose adjustments anticipated- pharmacy to sign off. Please re-consult as needed  Elson ClanLilliston, Jeramine Delis Michelle 03/09/2015,7:43 AM

## 2015-03-09 NOTE — ED Provider Notes (Signed)
CSN: [id number]     Arrival date & time 03/09/15  0048 History   First MD Initiated Contact with Patient 03/09/15 0103     Chief Complaint  Patient presents with  . Hematemesis   Chief complaint "I been bleeding meat"  (Consider location/radiation/quality/duration/timing/severity/associated sxs/prior Treatment) HPI  Patient reports about 2 weeks ago he had a regular point with his primary care doctor. He reports he has gout and he was unable to dorsiflex his right wrist due to an acute gouty attack. He was given a steroid injection that visit, he was seen a week later and received a second steroid injection and was also started on oral prednisone. He had had blood work done the first visit and when he came back for the second visit he was started on iron. Patient states he has pain in his right upper quadrant that he gets when he eats however when he started taking omeprazole that pain went away. However since he was started back on the steroids that pain has returned. He reports that Thursday, April 5, about 10:00 at night he had vomiting and coughing up bright red blood that lasted about 20 minutes. He states he feels like something is bubbling up in his throat and then comes out. He did not have it the following night. He had it Saturday night May 7 again about 10 PM. And then tonight about 7:30 PM he again started having vomiting and coughing up bright red blood. Patient states he has been seeing "meat" in the vomitus. However when I look at what he brought to the ER they are blood clots. He denies abdominal pain or black stools. He states he's had a normal appetite. He states tonight he started feeling dizzy and lightheaded and weak. He states he's never had this problem before.   PCP Dr Neita Carp Cardiology Dr Levonne Spiller  Past Medical History  Diagnosis Date  . Heart attack   . Low back pain   . Diabetic neuropathy   . Diabetes type 2, controlled   . Gout   . Hypertension   . Erectile  dysfunction   . CAD (coronary artery disease)   . Hypercholesteremia   . Anemia    Past Surgical History  Procedure Laterality Date  . Coronary angioplasty with stent placement    . Insert / replace / remove pacemaker     History reviewed. No pertinent family history. History  Substance Use Topics  . Smoking status: Current Every Day Smoker  . Smokeless tobacco: Not on file  . Alcohol Use: No  lives at home Lives with spouse  Review of Systems  All other systems reviewed and are negative.     Allergies  Statins  Home Medications   Prior to Admission medications   Medication Sig Start Date End Date Taking? Authorizing Provider  Albuterol Sulfate (PROAIR HFA IN) Inhale into the lungs.    Historical Provider, MD  ALPRAZolam Prudy Feeler) 1 MG tablet  09/12/13   Historical Provider, MD  aspirin 81 MG tablet Take 81 mg by mouth daily.    Historical Provider, MD  carvedilol (COREG) 25 MG tablet Take 25 mg by mouth 2 (two) times daily.    Historical Provider, MD  Colchicine (COLCRYS PO) Take by mouth.    Historical Provider, MD  cyclobenzaprine (FLEXERIL) 10 MG tablet Take by mouth.  08/19/12   Historical Provider, MD  diazepam (VALIUM) 5 MG tablet Take 5 mg by mouth.  08/17/12   Historical Provider, MD  Fluticasone-Salmeterol (ADVAIR HFA IN) Inhale into the lungs.    Historical Provider, MD  gabapentin (NEURONTIN) 300 MG capsule Take 400 mg by mouth 2 (two) times daily.  08/17/12   Historical Provider, MD  glipiZIDE (GLUCOTROL XL) 10 MG 24 hr tablet Take 10 mg by mouth 2 (two) times daily.  08/21/12   Historical Provider, MD  HYDROmorphone (DILAUDID) 4 MG tablet Take 4 mg by mouth.  08/22/12   Historical Provider, MD  metFORMIN (GLUCOPHAGE-XR) 500 MG 24 hr tablet Take 500 mg by mouth 4 (four) times daily.  08/21/12   Historical Provider, MD  omeprazole (PRILOSEC) 40 MG capsule Take 40 mg by mouth daily.  08/21/12   Historical Provider, MD  promethazine (PHENERGAN) 25 MG tablet   09/12/13   Historical Provider, MD  ramipril (ALTACE) 10 MG capsule Take 10 mg by mouth daily.  08/21/12   Historical Provider, MD  Tiotropium Bromide Monohydrate (SPIRIVA HANDIHALER IN) Inhale into the lungs.    Historical Provider, MD  ZETIA 10 MG tablet Take 10 mg by mouth daily.  06/24/12   Historical Provider, MD   Advair Iron started this week   BP 142/67 mmHg  Pulse 83  Temp(Src) 98.3 F (36.8 C) (Oral)  Resp 20  Ht 5\' 10"  (1.778 m)  Wt 170 lb (77.111 kg)  BMI 24.39 kg/m2  SpO2 96%  Vital signs normal   Physical Exam  Constitutional: He is oriented to person, place, and time. He appears well-developed and well-nourished.  Non-toxic appearance. He does not appear ill. No distress.  HENT:  Head: Normocephalic and atraumatic.  Right Ear: External ear normal.  Left Ear: External ear normal.  Nose: Nose normal. No mucosal edema or rhinorrhea.  Mouth/Throat: Oropharynx is clear and moist and mucous membranes are normal. No dental abscesses or uvula swelling.  Eyes: Conjunctivae and EOM are normal. Pupils are equal, round, and reactive to light.  Conjunctiva are pale  Neck: Normal range of motion and full passive range of motion without pain. Neck supple.  Cardiovascular: Normal rate, regular rhythm and normal heart sounds.  Exam reveals no gallop and no friction rub.   No murmur heard. Pulmonary/Chest: Effort normal and breath sounds normal. No respiratory distress. He has no wheezes. He has no rhonchi. He has no rales. He exhibits no tenderness and no crepitus.  Abdominal: Soft. Normal appearance and bowel sounds are normal. He exhibits no distension. There is no tenderness. There is no rebound and no guarding.  Musculoskeletal: Normal range of motion. He exhibits no edema or tenderness.  Moves all extremities well.   Neurological: He is alert and oriented to person, place, and time. He has normal strength. No cranial nerve deficit.  Skin: Skin is warm, dry and intact. No rash  noted. No erythema. There is pallor.  Psychiatric: He has a normal mood and affect. His speech is normal and behavior is normal. His mood appears not anxious.  Nursing note and vitals reviewed.   ED Course  Procedures (including critical care time)  Medications  0.9 %  sodium chloride infusion (0 mLs Intravenous Stopped 03/09/15 0319)  iohexol (OMNIPAQUE) 300 MG/ML solution 80 mL (100 mLs Intravenous Contrast Given 03/09/15 0356)   Patient got very angry with me when I told him what I saw was blood clots. He is insisting he is coughing or vomiting up "meat".  3:00 AM patient given his test results. He was typed and crossed for 2 units of blood and 1 unit was ordered  to be released. We discussed doing a chest CT because of his abnormal chest x-ray. We also discussed he needed to be admitted.  03:23 Dr Sharl Ma, admit to [facility] Review  Results for orders placed or performed during the hospital encounter of 03/09/15  Culture, blood (routine x 2)  Result Value Ref Range   Specimen Description RIGHT ANTECUBITAL    Special Requests BOTTLES DRAWN AEROBIC AND ANAEROBIC 6CC    Culture PENDING    Report Status PENDING   Culture, blood (routine x 2)  Result Value Ref Range   Specimen Description BLOOD RIGHT HAND    Special Requests BOTTLES DRAWN AEROBIC AND ANAEROBIC 6CC    Culture PENDING    Report Status PENDING   Comprehensive metabolic panel  Result Value Ref Range   Sodium 132 (L) 135 - 145 mmol/L   Potassium 4.6 3.5 - 5.1 mmol/L   Chloride 99 (L) 101 - 111 mmol/L   CO2 23 22 - 32 mmol/L   Glucose, Bld 214 (H) 70 - 99 mg/dL   BUN 33 (H) 6 - 20 mg/dL   Creatinine, Ser 4.09 (H) 0.61 - 1.24 mg/dL   Calcium 8.7 (L) 8.9 - 10.3 mg/dL   Total Protein 6.0 (L) 6.5 - 8.1 g/dL   Albumin 3.2 (L) 3.5 - 5.0 g/dL   AST 14 (L) 15 - 41 U/L   ALT 14 (L) 17 - 63 U/L   Alkaline Phosphatase 43 38 - 126 U/L   Total Bilirubin 0.4 0.3 - 1.2 mg/dL   GFR calc non Af Amer 52 (L) >60 mL/min   GFR  calc Af Amer 60 (L) >60 mL/min   Anion gap 10 5 - 15  CBC with Differential  Result Value Ref Range   WBC 13.3 (H) 4.0 - 10.5 K/uL   RBC 3.19 (L) 4.22 - 5.81 MIL/uL   Hemoglobin 8.2 (L) 13.0 - 17.0 g/dL   HCT 81.1 (L) 91.4 - 78.2 %   MCV 78.7 78.0 - 100.0 fL   MCH 25.7 (L) 26.0 - 34.0 pg   MCHC 32.7 30.0 - 36.0 g/dL   RDW 95.6 (H) 21.3 - 08.6 %   Platelets 240 150 - 400 K/uL   Neutrophils Relative % 79 (H) 43 - 77 %   Neutro Abs 10.5 (H) 1.7 - 7.7 K/uL   Lymphocytes Relative 16 12 - 46 %   Lymphs Abs 2.1 0.7 - 4.0 K/uL   Monocytes Relative 5 3 - 12 %   Monocytes Absolute 0.6 0.1 - 1.0 K/uL   Eosinophils Relative 0 0 - 5 %   Eosinophils Absolute 0.0 0.0 - 0.7 K/uL   Basophils Relative 0 0 - 1 %   Basophils Absolute 0.0 0.0 - 0.1 K/uL  APTT  Result Value Ref Range   aPTT 26 24 - 37 seconds  Protime-INR  Result Value Ref Range   Prothrombin Time 13.8 11.6 - 15.2 seconds   INR 1.05 0.00 - 1.49  CBC  Result Value Ref Range   WBC 9.4 4.0 - 10.5 K/uL   RBC 2.89 (L) 4.22 - 5.81 MIL/uL   Hemoglobin 7.5 (L) 13.0 - 17.0 g/dL   HCT 57.8 (L) 46.9 - 62.9 %   MCV 78.5 78.0 - 100.0 fL   MCH 26.0 26.0 - 34.0 pg   MCHC 33.0 30.0 - 36.0 g/dL   RDW 52.8 (H) 41.3 - 24.4 %   Platelets 190 150 - 400 K/uL   Hemoccult + but  not grossly bloody or black   Laboratory interpretation all normal except stable renal insufficiency with elevated BUN c/w GI bleeding. New anemia from 2012     Imaging Review Ct Chest W Contrast  03/09/2015   CLINICAL DATA:  Initial evaluation for hemoptysis.  EXAM: CT CHEST WITH CONTRAST  TECHNIQUE: Multidetector CT imaging of the chest was performed during intravenous contrast administration.  CONTRAST:  OMNIPAQUE IOHEXOL 300 MG/ML  SOLN  COMPARISON:  Prior radiograph performed earlier on the same day.  FINDINGS: Thyroid gland within normal limits.  1.6 cm pretracheal node is present (series 2, image 11). Adjacent enlarged node measuring approximately 1.7 cm in  short axis also present (series 2, image 14). Distally, there is an a large right pretracheal node measuring 2.2 cm in short axis (series 2, image 25). Right hilar adenopathy measures up to 1.9 cm. Central hypodensity within these nodes suggest necrosis. No left hilar or axillary adenopathy. There is an enlarged right supraclavicular node measuring 1.8 cm in short axis (series 2, image 2).  Intrathoracic aorta of normal caliber. Scattered atheromatous plaque present within the aortic arch in at the origin of the great vessels. No aneurysm.  Heart size within normal limits. Left-sided pacemaker noted. Scattered 3 vessel coronary artery calcifications present. No pericardial effusion.  There is patchy and irregular ground-glass opacities throughout the right upper lobe with involvement of the right middle lobe as well. Findings are suspicious for possible multi lobar pneumonia. There are is a superimposed 8 mm nodule within the right middle lobe (series 3, image 39). Right lower lobe remains relatively spared. The left lung is clear. Scattered emphysematous changes noted. No pulmonary edema or pleural effusion.  2.5 cm cyst partially visualized within the right kidney. Remainder the visualized upper abdomen within normal limits. Prominent atheromatous disease present within the intra-abdominal aorta and its branch vessels.  No acute osseous abnormality. No worrisome lytic or blastic osseous lesions.  IMPRESSION: 1. Irregular patchy and ground-glass opacities involving the right upper and middle lobes, suspicious for multi lobar pneumonia. Radiographic followup to resolution is recommended. 2. Enlarged mediastinal, right hilar, and right supraclavicular adenopathy as above, likely reactive in nature. 3. Three vessel coronary artery calcifications with prominent atheromatous plaque within the visualized aorta. No aneurysm. 4. Emphysema.   Electronically Signed   By: Rise Mu M.D.   On: 03/09/2015 04:39    Dg Chest Portable 1 View  03/09/2015   CLINICAL DATA:  Vomiting and hemoptysis for 4 days.  Dyspnea today.  EXAM: PORTABLE CHEST - 1 VIEW  COMPARISON:  06/24/2010  FINDINGS: There is right upper lobe infiltrate. Question right hilar adenopathy. Left lung is clear. There are no large effusions.  IMPRESSION: Right upper lobe infiltrate, possibly pneumonia. If there is clinical evidence of pneumonia, follow-up PA and lateral chest X-ray is recommended in 3-4 weeks following trial of antibiotic therapy to ensure resolution and exclude underlying malignancy. If the patient does not have clinical pneumonia, recommend chest CT with contrast to exclude malignancy.   Electronically Signed   By: Ellery Plunk M.D.   On: 03/09/2015 02:04     EKG Interpretation None      MDM patient presents with 4 days of either vomiting and/or coughing up blood after being started on steroids for an acute gouty flareup in his right wrist. He most likely has a gastritis or ulcer causing bleeding. He also had abnormal chest x-ray and is a heavy smoker. Chest CT shows possible infiltrate he does  have some lymphadenopathy felt to be reactive.    Final diagnoses:  Hemoptysis  Abnormal chest x-ray  Hematemesis without nausea  Anemia, unspecified anemia type    Plan admission   Devoria AlbeIva Kooper Godshall, MD, FACEP  CRITICAL CARE Performed by: Devoria AlbeKNAPP,Sundee Garland L Total critical care time: 38 min Critical care time was exclusive of separately billable procedures and treating other patients. Critical care was necessary to treat or prevent imminent or life-threatening deterioration. Critical care was time spent personally by me on the following activities: development of treatment plan with patient and/or surrogate as well as nursing, discussions with consultants, evaluation of patient's response to treatment, examination of patient, obtaining history from patient or surrogate, ordering and performing treatments and interventions, ordering  and review of laboratory studies, ordering and review of radiographic studies, pulse oximetry and re-evaluation of patient's condition.        Devoria AlbeIva Zohaib Heeney, MD 03/09/15 [phone]

## 2015-03-09 NOTE — Consult Note (Signed)
Referring Provider: Toya Smothers, NP Primary Care Physician:  Estanislado Pandy, MD Primary Gastroenterologist:  Dr. Jena Gauss   Date of Admission: 03/09/15 Date of Consultation: 03/09/15  Reason for Consultation:  Hematemesis   HPI:  [name] [name] is a 64 y.o. year old male who presented to the ED with acute onset of blood emesis and hemoptysis, prompting ED evaluation. States symptoms onset Thursday, with N/V "out of the blue", dark and bright red blood, no melena. States he had been on oral prednisone for 4 days prior due to gout flare. No abdominal pain. Remote history of dyspepsia and placed on omeprazole with symptom resolution. Once starting Prednisone, he noted recurrence of epigastric discomfort. No dysphagia. No concerning signs prior to acute onset Thursday. Also notes intermittent coughing, associated hemoptysis. CT chest shows suspicion for multi lobar pneumonia.   Admitting Hgb 8.2. Unknown baseline. 4 years ago Hgb was in 12 range. Drift in Hgb to 7.5. Received 1 unit PRBCs, completed around 10 am. No further vomiting since admission. Remote EGD and colonoscopy 10-12 years ago in [place] for "anemia". States normal at that time. Takes aspirin 81 mg daily, otherwise no NSAIDs or aspirin powders.   Past Medical History  Diagnosis Date  . Heart attack   . Low back pain   . Diabetic neuropathy   . Diabetes type 2, controlled   . Gout   . Hypertension   . Erectile dysfunction   . CAD (coronary artery disease)   . Hypercholesteremia   . Anemia   . Chronic systolic heart failure   . Paroxysmal ventricular tachycardia   . Ischemic cardiomyopathy     Past Surgical History  Procedure Laterality Date  . Coronary angioplasty with stent placement    . Cardiac defibrillator placement      Prior to Admission medications   Medication Sig Start Date End Date Taking? Authorizing Provider  albuterol (PROVENTIL HFA;VENTOLIN HFA) 108 (90 BASE) MCG/ACT inhaler Inhale 2 puffs into the lungs every  6 (six) hours as needed for wheezing or shortness of breath.   Yes Historical Provider, MD  ALPRAZolam Prudy Feeler) 1 MG tablet Take 1 mg by mouth 2 (two) times daily as needed for anxiety.  09/12/13  Yes Historical Provider, MD  aspirin 81 MG tablet Take 81 mg by mouth daily.   Yes Historical Provider, MD  carvedilol (COREG) 25 MG tablet Take 25 mg by mouth 2 (two) times daily.   Yes Historical Provider, MD  colchicine 0.6 MG tablet Take 0.6 mg by mouth daily as needed (gout).   Yes Historical Provider, MD  cyclobenzaprine (FLEXERIL) 10 MG tablet Take 10 mg by mouth 3 (three) times daily as needed for muscle spasms.  08/19/12  Yes Historical Provider, MD  ferrous fumarate (HEMOCYTE - 106 MG FE) 325 (106 FE) MG TABS tablet Take 1 tablet by mouth daily.   Yes Historical Provider, MD  Fluticasone-Salmeterol (ADVAIR) 100-50 MCG/DOSE AEPB Inhale 1 puff into the lungs 2 (two) times daily.   Yes Historical Provider, MD  gabapentin (NEURONTIN) 400 MG capsule Take 800 mg by mouth 2 (two) times daily.   Yes Historical Provider, MD  glipiZIDE (GLUCOTROL XL) 10 MG 24 hr tablet Take 20 mg by mouth daily with breakfast.  08/21/12  Yes Historical Provider, MD  HYDROmorphone (DILAUDID) 4 MG tablet Take 4 mg by mouth every 6 (six) hours as needed for moderate pain.  08/22/12  Yes Historical Provider, MD  metFORMIN (GLUCOPHAGE-XR) 500 MG 24 hr tablet Take 500 mg  by mouth 4 (four) times daily.  08/21/12  Yes Historical Provider, MD  omeprazole (PRILOSEC) 40 MG capsule Take 40 mg by mouth daily.  08/21/12  Yes Historical Provider, MD  promethazine (PHENERGAN) 25 MG tablet Take 12.5 mg by mouth every 6 (six) hours as needed for nausea or vomiting. Take with Dilaudid. 09/12/13  Yes Historical Provider, MD  ramipril (ALTACE) 10 MG capsule Take 10 mg by mouth daily.  08/21/12  Yes Historical Provider, MD  tiotropium (SPIRIVA) 18 MCG inhalation capsule Place 18 mcg into inhaler and inhale daily.   Yes Historical Provider, MD   ZETIA 10 MG tablet Take 10 mg by mouth daily.  06/24/12  Yes Historical Provider, MD    Current Facility-Administered Medications  Medication Dose Route Frequency Provider Last Rate Last Dose  . 0.9 %  sodium chloride infusion   Intravenous Continuous Gwenyth BenderKaren M Black, NP 75 mL/hr at 03/09/15 0445    . carvedilol (COREG) tablet 25 mg  25 mg Oral BID WC Meredeth IdeGagan S Lama, MD      . colchicine tablet 0.6 mg  0.6 mg Oral BID Meredeth IdeGagan S Lama, MD      . ezetimibe (ZETIA) tablet 10 mg  10 mg Oral Daily Meredeth IdeGagan S Lama, MD      . gabapentin (NEURONTIN) capsule 400 mg  400 mg Oral BID Meredeth IdeGagan S Lama, MD      . HYDROmorphone (DILAUDID) injection 1 mg  1 mg Intravenous Q4H PRN Meredeth IdeGagan S Lama, MD      . insulin aspart (novoLOG) injection 0-9 Units  0-9 Units Subcutaneous TID WC Meredeth IdeGagan S Lama, MD      . Melene Muller[START ON 03/10/2015] levofloxacin (LEVAQUIN) tablet 750 mg  750 mg Oral Daily Loleta RoseAndrea M Lilliston, RPH      . ondansetron (ZOFRAN) tablet 4 mg  4 mg Oral Q6H PRN Meredeth IdeGagan S Lama, MD       Or  . ondansetron (ZOFRAN) injection 4 mg  4 mg Intravenous Q6H PRN Meredeth IdeGagan S Lama, MD      . pantoprazole (PROTONIX) injection 40 mg  40 mg Intravenous Q12H Meredeth IdeGagan S Lama, MD      . ramipril (ALTACE) capsule 10 mg  10 mg Oral Daily Meredeth IdeGagan S Lama, MD      . sodium chloride 0.9 % injection           . sodium chloride 0.9 % injection           . tiotropium (SPIRIVA) inhalation capsule 18 mcg  18 mcg Inhalation Daily Meredeth IdeGagan S Lama, MD        Allergies as of 03/09/2015 - Review Complete 03/09/2015  Allergen Reaction Noted  . Statins Other (See Comments) 08/29/2012    Family History  Problem Relation Age of Onset  . Colon cancer Neg Hx     History   Social History  . Marital Status: Legally Separated    Spouse Name: N/A  . Number of Children: N/A  . Years of Education: N/A   Occupational History  . retired    Social History Main Topics  . Smoking status: Current Every Day Smoker -- 2.00 packs/day    Types: Cigarettes  . Smokeless  tobacco: Not on file  . Alcohol Use: No  . Drug Use: No  . Sexual Activity: Not on file   Other Topics Concern  . Not on file   Social History Narrative    Review of Systems: Gen: see HPI CV: Denies chest pain, heart  palpitations, syncope, edema  Resp: hemoptysis  GI: see HPI GU : Denies urinary burning, urinary frequency, urinary incontinence.  MS: back pain  Derm: Denies rash, itching, dry skin Psych: Denies depression, anxiety,confusion, or memory loss Heme: see HPI  Physical Exam: Vital signs in last 24 hours: Temp:  [97.8 F (36.6 C)-98.8 F (37.1 C)] 98.7 F (37.1 C) (05/09 0700) Pulse Rate:  [58-93] 61 (05/09 0700) Resp:  [16-20] 16 (05/09 0700) BP: (114-153)/(40-76) 122/46 mmHg (05/09 0700) SpO2:  [95 %-99 %] 95 % (05/09 0700) Weight:  [170 lb (77.111 kg)] 170 lb (77.111 kg) (05/09 0108) Last BM Date: 03/08/15 General:   Alert,  Well-developed, well-nourished, flat affect.  Head:  Normocephalic and atraumatic. Eyes:  Sclera clear, no icterus.   Conjunctiva pink. Ears:  Normal auditory acuity. Nose:  No deformity, discharge,  or lesions. Mouth:  No deformity or lesions, dentition normal. Lungs:  Coarse bilaterally  Heart:  S1 S2 present without murmrus Abdomen:  Soft, nontender and nondistended. No masses, hepatosplenomegaly or hernias noted. Umbilical hernia appreciated, easily reducible Rectal:  Deferred until time of colonoscopy.   Msk:  Symmetrical without gross deformities. Normal posture. Extremities:  Without  edema. Neurologic:  Alert and  oriented x4;  grossly normal neurologically. Skin:  Intact without significant lesions or rashes. Psych:  Alert and cooperative. Normal mood and affect.  Intake/Output from previous day: 05/08 0701 - 05/09 0700 In: 335 [Blood:335] Out: -  Intake/Output this shift:    Lab Results:  Recent Labs  03/09/15 0135 03/09/15 0436  WBC 13.3* 9.4  HGB 8.2* 7.5*  HCT 25.1* 22.7*  PLT 240 190   BMET  Recent  Labs  03/09/15 0135 03/09/15 0436  NA 132* 137  K 4.6 4.2  CL 99* 105  CO2 23 23  GLUCOSE 214* 87  BUN 33* 31*  CREATININE 1.38* 1.20  CALCIUM 8.7* 8.6*   LFT  Recent Labs  03/09/15 0135 03/09/15 0436  PROT 6.0* 5.6*  ALBUMIN 3.2* 3.0*  AST 14* 14*  ALT 14* 12*  ALKPHOS 43 39  BILITOT 0.4 0.4   PT/INR  Recent Labs  03/09/15 0135  LABPROT 13.8  INR 1.05    Studies/Results: Ct Chest W Contrast  03/09/2015   CLINICAL DATA:  Initial evaluation for hemoptysis.  EXAM: CT CHEST WITH CONTRAST  TECHNIQUE: Multidetector CT imaging of the chest was performed during intravenous contrast administration.  CONTRAST:  OMNIPAQUE IOHEXOL 300 MG/ML  SOLN  COMPARISON:  Prior radiograph performed earlier on the same day.  FINDINGS: Thyroid gland within normal limits.  1.6 cm pretracheal node is present (series 2, image 11). Adjacent enlarged node measuring approximately 1.7 cm in short axis also present (series 2, image 14). Distally, there is an a large right pretracheal node measuring 2.2 cm in short axis (series 2, image 25). Right hilar adenopathy measures up to 1.9 cm. Central hypodensity within these nodes suggest necrosis. No left hilar or axillary adenopathy. There is an enlarged right supraclavicular node measuring 1.8 cm in short axis (series 2, image 2).  Intrathoracic aorta of normal caliber. Scattered atheromatous plaque present within the aortic arch in at the origin of the great vessels. No aneurysm.  Heart size within normal limits. Left-sided pacemaker noted. Scattered 3 vessel coronary artery calcifications present. No pericardial effusion.  There is patchy and irregular ground-glass opacities throughout the right upper lobe with involvement of the right middle lobe as well. Findings are suspicious for possible multi lobar pneumonia. There  are is a superimposed 8 mm nodule within the right middle lobe (series 3, image 39). Right lower lobe remains relatively spared. The left  lung is clear. Scattered emphysematous changes noted. No pulmonary edema or pleural effusion.  2.5 cm cyst partially visualized within the right kidney. Remainder the visualized upper abdomen within normal limits. Prominent atheromatous disease present within the intra-abdominal aorta and its branch vessels.  No acute osseous abnormality. No worrisome lytic or blastic osseous lesions.  IMPRESSION: 1. Irregular patchy and ground-glass opacities involving the right upper and middle lobes, suspicious for multi lobar pneumonia. Radiographic followup to resolution is recommended. 2. Enlarged mediastinal, right hilar, and right supraclavicular adenopathy as above, likely reactive in nature. 3. Three vessel coronary artery calcifications with prominent atheromatous plaque within the visualized aorta. No aneurysm. 4. Emphysema.   Electronically Signed   By: Rise MuBenjamin  McClintock M.D.   On: 03/09/2015 04:39   Dg Chest Portable 1 View  03/09/2015   CLINICAL DATA:  Vomiting and hemoptysis for 4 days.  Dyspnea today.  EXAM: PORTABLE CHEST - 1 VIEW  COMPARISON:  06/24/2010  FINDINGS: There is right upper lobe infiltrate. Question right hilar adenopathy. Left lung is clear. There are no large effusions.  IMPRESSION: Right upper lobe infiltrate, possibly pneumonia. If there is clinical evidence of pneumonia, follow-up PA and lateral chest X-ray is recommended in 3-4 weeks following trial of antibiotic therapy to ensure resolution and exclude underlying malignancy. If the patient does not have clinical pneumonia, recommend chest CT with contrast to exclude malignancy.   Electronically Signed   By: Ellery Plunkaniel R Mitchell M.D.   On: 03/09/2015 02:04    Impression: 64 year old male admitted with acute onset of hematemesis, hemoptysis, and anemia, with resolution of hematemesis since admission. Notes remote history of dyspepsia that resolved with daily omeprazole but symptoms recurred since starting recent course of prednisone for  gout. 81 mg aspirin daily and recent course of prednisone otherwise no NSAIDs or aspirin powders routinely. Would benefit from direct visualization via EGD.   Anemia: unknown baseline for patient. Appears Hgb 12 range several years ago, with admitting Hgb 8.2. Slight drift to 7.5 multifactorial. 1 unit PRBCs received. Continue serial H/H. Will check iron, ferritin. Patient endorses history of last colonoscopy/EGD at least 10-12 years ago for "anemia", without significant findings. Needs outpatient colonoscopy for screening purposes.   Hemoptysis: per hospitalist. CT shows multi lobar pneumonia.   Plan: EGD with Dr. Jena Gaussourk today. Phenergan 12.5 mg IV on call due to chronic narcotics. Risks and benefits discussed in detail. (as of note, patient has defibrillator) Continue Protonix IV BID Check anemia panel Screening colonoscopy as outpatient Will continue to follow with you  Nira RetortAnna W. Sams, ANP-BC [facility]       LOS: 0 days    03/09/2015, 10:25 AM   Attending note:  Patient seen and examined in short stay. He describes recurrent esophageal dysphagia to solids with transient food impactions. Agree with need for EGD. Will offer him an  EGD with esophageal dilation as appropriate this afternoon.  The risks, benefits, limitations, alternatives and imponderables have been reviewed with the patient. Potential for esophageal dilation, biopsy, etc. have also been reviewed.  Questions have been answered. All parties agreeable.

## 2015-03-09 NOTE — ED Notes (Signed)
Patient states he has been vomiting and coughing up bright red blood since Thursday.

## 2015-03-09 NOTE — Progress Notes (Signed)
[facility] PROGRESS NOTE  [name] [name] ZOX:[id number] DOB: 1951/01/23 DOA: 03/09/2015 PCP: Estanislado Pandy, MD  Assessment/Plan: Pneumonia Chest x-ray shows right upper lobe infiltrate with possible pneumonia. Afebrile and non-toxic appearing. Levaquin day #2. Blood cultures no growth. Urinary strep antigen and legionella antigen in process. Recommend repeat chest x-ray in 2-4 weeks following a course of antibiotic therapy. Also recommend CT chest to exclude malignancy.   ? Upper GI bleed/hematemesis 3 episodes hematemesis after patient was put on steroids for gout attack. Heme positive.  Denies NSAID use.  No further bleeding. Reports hx of same 10 years ago when he was on Plavix.  Continue Protonix 40 mg IV every 12 hours. Reports colonoscopy/endoscopy 10 years ago with normal results. S/p transfusion 1 unit PRBC's. Will obtain serial CBC and monitor. Await GI recommendations  Diabetes mellitus Continue to hold oral hypoglycemic agents. Continue scale insulin with NovoLog. A1c penidning  Anemia 1. Blood loss anemia likely related to #2. Hemoglobin is 8.2 on admission. transfused 1 unit of PRBC. Will continue serial CBC and monitor closely. Hx macrocytic anemia and is on iron supplement.  Await GI recommendations  Chronic systolic heart failure: per chart review s/p ICD implantation. Compensated.  recently evaluated by cards. Recent echo with EF 55% with hypokinesis grade 2 diastolic dysfunction. Home medications include coreg, and ramipril. Continue these. Monitor intake and output. Obtain daily weights.   Ischemic cardiomyopathy: s/p MI. Chart review indicates he has reached ERI. Recent cards note indicates patient not sure if he wants new device. Repeat echo as above. No chest pain/sob.   HTN: controlled. Continue home meds  COPD: appears stable at baseline. Continue home inhaler.   Tobacco use: cessation counseling offered  Code Status: full Family Communication: girlfriend at  bedside Disposition Plan: home hopefully 24 -48 hours   Consultants:  GI  Procedures:  Echo 02/23/15 (OP) Mild LVH with LVEF approximately 55%, mid to basal inferolateral hypokinesis to akinesis consistent with ischemic heart disease. Unable to compare with prior echocardiographic LVEF assessment (previously 45% by LV angiography report 2007). Grade 2 diastolic dysfunction with mild left atrial enlargement. MAC and sclerotic aortic valve. Device wire present in right heart. Unable to assess PASP.  Antibiotics:  levaquin 03/09/15.  HPI/Subjective: Sitting on side of bed. Denies pain/discomfort/bleeding  Objective: Filed Vitals:   03/09/15 0700  BP: 122/46  Pulse: 61  Temp: 98.7 F (37.1 C)  Resp: 16    Intake/Output Summary (Last 24 hours) at 03/09/15 0942 Last data filed at 03/09/15 0645  Gross per 24 hour  Intake    335 ml  Output      0 ml  Net    335 ml   Filed Weights   03/09/15 0108  Weight: 77.111 kg (170 lb)    Exam:   General:  Well nourished  Cardiovascular: RRR no No MGR No LE edema  Respiratory: normal effort BS coarse  Abdomen: non-distended +BS   Musculoskeletal: no clubbing or cyanosis right wrist slightly swollen and tender   Data Reviewed: Basic Metabolic Panel:  Recent Labs Lab 03/09/15 0135 03/09/15 0436  NA 132* 137  K 4.6 4.2  CL 99* 105  CO2 23 23  GLUCOSE 214* 87  BUN 33* 31*  CREATININE 1.38* 1.20  CALCIUM 8.7* 8.6*   Liver Function Tests:  Recent Labs Lab 03/09/15 0135 03/09/15 0436  AST 14* 14*  ALT 14* 12*  ALKPHOS 43 39  BILITOT 0.4 0.4  PROT 6.0* 5.6*  ALBUMIN 3.2* 3.0*  No results for input(s): LIPASE, AMYLASE in the last 168 hours. No results for input(s): AMMONIA in the last 168 hours. CBC:  Recent Labs Lab 03/09/15 0135 03/09/15 0436  WBC 13.3* 9.4  NEUTROABS 10.5*  --   HGB 8.2* 7.5*  HCT 25.1* 22.7*  MCV 78.7 78.5  PLT 240 190   Cardiac Enzymes: No results for input(s):  CKTOTAL, CKMB, CKMBINDEX, TROPONINI in the last 168 hours. BNP (last 3 results) No results for input(s): BNP in the last 8760 hours.  ProBNP (last 3 results) No results for input(s): PROBNP in the last 8760 hours.  CBG:  Recent Labs Lab 03/09/15 0833  GLUCAP 86    Recent Results (from the past 240 hour(s))  Culture, blood (routine x 2)     Status: None (Preliminary result)   Collection Time: 03/09/15  4:36 AM  Result Value Ref Range Status   Specimen Description RIGHT ANTECUBITAL  Final   Special Requests BOTTLES DRAWN AEROBIC AND ANAEROBIC 6CC  Final   Culture PENDING  Incomplete   Report Status PENDING  Incomplete  Culture, blood (routine x 2)     Status: None (Preliminary result)   Collection Time: 03/09/15  4:40 AM  Result Value Ref Range Status   Specimen Description BLOOD RIGHT HAND  Final   Special Requests BOTTLES DRAWN AEROBIC AND ANAEROBIC [facility]6CC  Final   Culture PENDING  Incomplete   Report Status PENDING  Incomplete     Studies: Ct Chest W Contrast  03/09/2015   CLINICAL DATA:  Initial evaluation for hemoptysis.  EXAM: CT CHEST WITH CONTRAST  TECHNIQUE: Multidetector CT imaging of the chest was performed during intravenous contrast administration.  CONTRAST:  100mL OMNIPAQUE IOHEXOL 300 MG/ML  SOLN  COMPARISON:  Prior radiograph performed earlier on the same day.  FINDINGS: Thyroid gland within normal limits.  1.6 cm pretracheal node is present (series 2, image 11). Adjacent enlarged node measuring approximately 1.7 cm in short axis also present (series 2, image 14). Distally, there is an a large right pretracheal node measuring 2.2 cm in short axis (series 2, image 25). Right hilar adenopathy measures up to 1.9 cm. Central hypodensity within these nodes suggest necrosis. No left hilar or axillary adenopathy. There is an enlarged right supraclavicular node measuring 1.8 cm in short axis (series 2, image 2).  Intrathoracic aorta of normal caliber. Scattered atheromatous  plaque present within the aortic arch in at the origin of the great vessels. No aneurysm.  Heart size within normal limits. Left-sided pacemaker noted. Scattered 3 vessel coronary artery calcifications present. No pericardial effusion.  There is patchy and irregular ground-glass opacities throughout the right upper lobe with involvement of the right middle lobe as well. Findings are suspicious for possible multi lobar pneumonia. There are is a superimposed 8 mm nodule within the right middle lobe (series 3, image 39). Right lower lobe remains relatively spared. The left lung is clear. Scattered emphysematous changes noted. No pulmonary edema or pleural effusion.  2.5 cm cyst partially visualized within the right kidney. Remainder the visualized upper abdomen within normal limits. Prominent atheromatous disease present within the intra-abdominal aorta and its branch vessels.  No acute osseous abnormality. No worrisome lytic or blastic osseous lesions.  IMPRESSION: 1. Irregular patchy and ground-glass opacities involving the right upper and middle lobes, suspicious for multi lobar pneumonia. Radiographic followup to resolution is recommended. 2. Enlarged mediastinal, right hilar, and right supraclavicular adenopathy as above, likely reactive in nature. 3. Three vessel coronary artery calcifications  with prominent atheromatous plaque within the visualized aorta. No aneurysm. 4. Emphysema.   Electronically Signed   By: Rise MuBenjamin  McClintock M.D.   On: 03/09/2015 04:39   Dg Chest Portable 1 View  03/09/2015   CLINICAL DATA:  Vomiting and hemoptysis for 4 days.  Dyspnea today.  EXAM: PORTABLE CHEST - 1 VIEW  COMPARISON:  06/24/2010  FINDINGS: There is right upper lobe infiltrate. Question right hilar adenopathy. Left lung is clear. There are no large effusions.  IMPRESSION: Right upper lobe infiltrate, possibly pneumonia. If there is clinical evidence of pneumonia, follow-up PA and lateral chest X-ray is recommended in  3-4 weeks following trial of antibiotic therapy to ensure resolution and exclude underlying malignancy. If the patient does not have clinical pneumonia, recommend chest CT with contrast to exclude malignancy.   Electronically Signed   By: Ellery Plunkaniel R Mitchell M.D.   On: 03/09/2015 02:04    Scheduled Meds: . carvedilol  25 mg Oral BID WC  . colchicine  0.6 mg Oral BID  . ezetimibe  10 mg Oral Daily  . gabapentin  400 mg Oral BID  . insulin aspart  0-9 Units Subcutaneous TID WC  . [START ON 03/10/2015] levofloxacin  750 mg Oral Daily  . pantoprazole (PROTONIX) IV  40 mg Intravenous Q12H  . ramipril  10 mg Oral Daily  . sodium chloride      . sodium chloride      . tiotropium  18 mcg Inhalation Daily   Continuous Infusions: . sodium chloride 75 mL/hr at 03/09/15 0445    Active Problems:   Diabetes   Essential hypertension   Coronary atherosclerosis   Cardiomyopathy, ischemic   Automatic implantable cardioverter-defibrillator in situ   Tobacco abuse   Pneumonia   Chronic systolic heart failure GI bleed Anemia CAP    Time spent: 30 minutes    [facility] HospitalBLACK,KAREN M  [facility] Pager [phone]. If 7PM-7AM, please contact night-coverage at [email or web address], password [email or web address] 03/09/2015, 9:42 AM  LOS: 0 days

## 2015-03-09 NOTE — Care Management Note (Signed)
Case Management Note  Patient Details  Name: [name] [name] MRN: [id number] Date of Birth: April 13, 1951  Subjective/Objective:                  Pt admitted from home with pneumonia and gi bleeding. Pt lives alone and will return home at discharge. Pt is independent with ADL's but has a cane if needed.  Action/Plan: No CM needs noted. Anticipate discharge within 24-48 hours.  Expected Discharge Date:  03/12/15               Expected Discharge Plan:  Home/Self Care  In-House Referral:  NA  Discharge planning Services  CM Consult  Post Acute Care Choice:  NA Choice offered to:  NA  DME Arranged:    DME Agency:     HH Arranged:    HH Agency:     Status of Service:  Completed, signed off  Medicare Important Message Given:    Date Medicare IM Given:    Medicare IM give by:    Date Additional Medicare IM Given:    Additional Medicare Important Message give by:     If discussed at Long Length of Stay Meetings, dates discussed:    Additional Comments:  Cheryl FlashBlackwell, Veanna Dower Crowder, RN 03/09/2015, 12:42 PM

## 2015-03-10 ENCOUNTER — Telehealth: Payer: Self-pay | Admitting: Gastroenterology

## 2015-03-10 ENCOUNTER — Encounter (HOSPITAL_COMMUNITY): Payer: Self-pay | Admitting: Internal Medicine

## 2015-03-10 DIAGNOSIS — I255 Ischemic cardiomyopathy: Secondary | ICD-10-CM

## 2015-03-10 DIAGNOSIS — R110 Nausea: Secondary | ICD-10-CM

## 2015-03-10 DIAGNOSIS — I10 Essential (primary) hypertension: Secondary | ICD-10-CM

## 2015-03-10 DIAGNOSIS — K920 Hematemesis: Secondary | ICD-10-CM

## 2015-03-10 DIAGNOSIS — D649 Anemia, unspecified: Secondary | ICD-10-CM

## 2015-03-10 LAB — CBC
HEMATOCRIT: 22.5 % — AB (ref 39.0–52.0)
Hemoglobin: 7.3 g/dL — ABNORMAL LOW (ref 13.0–17.0)
MCH: 26.3 pg (ref 26.0–34.0)
MCHC: 32.4 g/dL (ref 30.0–36.0)
MCV: 80.9 fL (ref 78.0–100.0)
Platelets: 165 10*3/uL (ref 150–400)
RBC: 2.78 MIL/uL — ABNORMAL LOW (ref 4.22–5.81)
RDW: 16.7 % — ABNORMAL HIGH (ref 11.5–15.5)
WBC: 6.6 10*3/uL (ref 4.0–10.5)

## 2015-03-10 LAB — BASIC METABOLIC PANEL
Anion gap: 5 (ref 5–15)
BUN: 19 mg/dL (ref 6–20)
CO2: 27 mmol/L (ref 22–32)
Calcium: 8.5 mg/dL — ABNORMAL LOW (ref 8.9–10.3)
Chloride: 106 mmol/L (ref 101–111)
Creatinine, Ser: 1.21 mg/dL (ref 0.61–1.24)
Glucose, Bld: 211 mg/dL — ABNORMAL HIGH (ref 70–99)
Potassium: 4.3 mmol/L (ref 3.5–5.1)
SODIUM: 138 mmol/L (ref 135–145)

## 2015-03-10 LAB — HEMOGLOBIN A1C
Hgb A1c MFr Bld: 7.1 % — ABNORMAL HIGH (ref 4.8–5.6)
MEAN PLASMA GLUCOSE: 157 mg/dL

## 2015-03-10 LAB — GLUCOSE, CAPILLARY
GLUCOSE-CAPILLARY: 176 mg/dL — AB (ref 70–99)
Glucose-Capillary: 215 mg/dL — ABNORMAL HIGH (ref 70–99)

## 2015-03-10 MED ORDER — OMEPRAZOLE 40 MG PO CPDR: 40.0000 mg | DELAYED_RELEASE_CAPSULE | Freq: Two times a day (BID) | ORAL | Status: AC

## 2015-03-10 MED ORDER — LEVOFLOXACIN 750 MG PO TABS: 750.0000 mg | ORAL_TABLET | Freq: Every day | ORAL | Status: AC

## 2015-03-10 NOTE — Progress Notes (Signed)
Pt discharged home in stable condition with girlfriend.  Pt received discharge instructions and prescriptions and had no further questions or concerns.  Pt escorted downstairs via patient advocate.

## 2015-03-10 NOTE — Care Management Note (Signed)
Case Management Note  Patient Details  Name: [name] [name] MRN: [id number] Date of Birth: September 25, 1951  Subjective/Objective:                    Action/Plan:   Expected Discharge Date:  03/12/15               Expected Discharge Plan:  Home/Self Care  In-House Referral:  NA  Discharge planning Services  CM Consult  Post Acute Care Choice:  NA Choice offered to:  NA  DME Arranged:    DME Agency:     HH Arranged:    HH Agency:     Status of Service:  Completed, signed off  Medicare Important Message Given:  N/A - LOS <3 / Initial given by admissions Date Medicare IM Given:    Medicare IM give by:    Date Additional Medicare IM Given:    Additional Medicare Important Message give by:     If discussed at Long Length of Stay Meetings, dates discussed:    Additional Comments: Pt discharged home today. No CM needs noted. Arlyss QueenBlackwell, Mak Bonny Ossianrowder, RN 03/10/2015, 3:18 PM

## 2015-03-10 NOTE — Progress Notes (Signed)
Inpatient Diabetes Program Recommendations  AACE/ADA: New Consensus Statement on Inpatient Glycemic Control (2013)  Target Ranges:  Prepandial:   less than 140 mg/dL      Peak postprandial:   less than 180 mg/dL (1-2 hours)      Critically ill patients:  140 - 180 mg/dL   Results for Ronal FearCKLE, Imran W (MRN [id number]) as of 03/10/2015 09:13  Ref. Range 03/09/2015 08:33 03/09/2015 12:07 03/09/2015 16:11 03/09/2015 17:07 03/09/2015 22:41 03/10/2015 07:44  Glucose-Capillary Latest Ref Range: 70-99 mg/dL 86 99 409100 (H) 811118 (H) 914269 (H) 215 (H)   Diabetes history: DM2 Outpatient Diabetes medications: Glipizide  XL 20 mg QAM, Metformin 500 mg QID Current orders for Inpatient glycemic control: Novolog 0-9 units TID with meals  Inpatient Diabetes Program Recommendations Insulin - Basal: Please consider ordering low dose basal insulin; recommend ordering Levemir 5 units daily. Correction (SSI): Please consider ordering Novolog bedtime correction scale.  Thanks, Orlando PennerMarie Serah Nicoletti, RN, MSN, CCRN, CDE Diabetes Coordinator Inpatient Diabetes Program [phone] (Team Pager from 8am to 5pm) [phone] ([facility] office) [phone] [facility] office)

## 2015-03-10 NOTE — Telephone Encounter (Signed)
APPOINTMENT MADE AND HOSPITAL AWARE OF DATE AND TIME

## 2015-03-10 NOTE — Telephone Encounter (Signed)
Please arrange outpatient hospital follow-up to set up screening colonoscopy. Looks like he has possible early IDA. Ferritin slightly below normal.

## 2015-03-10 NOTE — Discharge Summary (Signed)
Physician Discharge Summary  Ronal FearGeorge W Depaolis JYN:[id number] DOB: April 09, 1951 DOA: 03/09/2015  PCP: Estanislado PandySASSER,PAUL W, MD  Admit date: 03/09/2015 Discharge date: 03/10/2015  Time spent: 40 minutes  Recommendations for Outpatient Follow-up:  1. Follow up with GI in 3-4 weeks. Follow anemia panel, consider OP colonoscopy 2. PCP 1-2 weeks evaluation of DM control, recommend CBC to track anemia, consider ENT referral if appropriate. Recommend repeat chest xray in 2-4 weeks. Consider CT chest  Discharge Diagnoses:  Active Problems:   Diabetes   Essential hypertension   Coronary atherosclerosis   Cardiomyopathy, ischemic   Automatic implantable cardioverter-defibrillator in situ   Tobacco abuse   Pneumonia   Chronic systolic heart failure   Hematemesis with nausea   Schatzki's ring   Hiatal hernia   Discharge Condition: stable  Diet recommendation: carb modified  Filed Weights   03/09/15 0108 03/10/15 0546  Weight: 77.111 kg (170 lb) 76.5 kg (168 lb 10.4 oz)    History of present illness:  64 year old male who  has a past medical history of Heart attack; Low back pain; Diabetic neuropathy; Diabetes type 2, controlled; Gout; Hypertension; Erectile dysfunction; CAD (coronary artery disease); Hypercholesteremia; and Anemia. CAD status post stents pressented to ED on 03/09/15 cc coughing blood Patient reported that 2 weeks prior he had regular appointment with his PCP at that time he had gout and was unable to move the right wrist. At that time he received steroid injection and a week later he was seen and was given another steroid injection and started on oral prednisone. Patient reported that he had chronic abdominal pain and  for that he was started on omeprazole in the past and that had resolved the pain. After patient was started on prednisone the abdominal pain returned. Patient developed vomiting and also coughing up bright red blood. He had repeat episode on  before he came to the hospital. He  denied any Alisson Rozell colored stools. In the ED bedside stool for occult blood was positive.  Chest x-ray showed pneumonia. Patient also found to have anemia with-Hg 8.2, 1 unit of PRBC given. He denied chest pain, does get short of breath on exertion. He denied fever, no dysuria urgency or frequency of urination.  Hospital Course:  Pneumonia Chest x-ray showed right upper lobe infiltrate with possible pneumonia. He remained afebrile and non-toxic appearing. Received Levaquin for 2 days. Will be discharged with 5 days to complete 7 days.  Blood cultures no growth. Urinary strep antigen negative and legionella antigen in process at discharge . Recommend repeat chest x-ray in 2-4 weeks following a course of antibiotic therapy. Also recommend CT chest to exclude malignancy.   ? Upper GI bleed/hematemesis 3 episodes hematemesis after patient was put on steroids for gout attack. Heme positive. Denied NSAID use. No further bleeding. Evaluated by GI and EGD done revealing Schatzki's ring s/p dilation, small hiatal hernia, otherwise normal EGD. Consier pulmonary/ENT as source for overt bleeding. GI recommending PPI BID OP colonoscopy  Diabetes mellitus A1c 7.1. Recommend close OP follow up for optimal control.  Anemia Blood loss anemia likely related to #2.  transfused 1 unit of PRBC and Hg stable at 7.8 at discharge. Hx macrocytic anemia and is on iron supplement. GI will follow up on iron level.   Chronic systolic heart failure: per chart review s/p ICD implantation. Compensated. recently evaluated by cards. Recent echo with EF 55% with hypokinesis grade 2 diastolic dysfunction.    Ischemic cardiomyopathy: s/p MI. Chart review indicates he has reached  ERI. Recent cards note indicates patient not sure if he wants new device.  No chest pain/sob.   HTN: controlled.   COPD: remained stable at baseline. .   Tobacco use: cessation counseling offered  Procedures: EGD 03/09/15 Schatzki's ring?"status  post esophageal dilation as described above. Small hiatal hernia; otherwise normal EGD. I doubt significant, if any, upper GI bleeding recently   Consultations:  GI  Discharge Exam: Filed Vitals:   03/10/15 0546  BP: 118/55  Pulse: 74  Temp: 98.6 F (37 C)  Resp: 20    General: well nourished calm comfortable Cardiovascular: S1 and S2 no m/g/r no LE edema Respiratory: normal effort BS coarse but good air flow Abdomen: soft +BS non-tender  Discharge Instructions    Current Discharge Medication List    START taking these medications   Details  levofloxacin (LEVAQUIN) 750 MG tablet Take 1 tablet (750 mg total) by mouth daily. Qty: 5 tablet, Refills: 0      CONTINUE these medications which have CHANGED   Details  omeprazole (PRILOSEC) 40 MG capsule Take 1 capsule (40 mg total) by mouth 2 (two) times daily. Qty: 60 capsule, Refills: 1   Associated Diagnoses: Automatic implantable cardioverter-defibrillator in situ; Tobacco use disorder      CONTINUE these medications which have NOT CHANGED   Details  albuterol (PROVENTIL HFA;VENTOLIN HFA) 108 (90 BASE) MCG/ACT inhaler Inhale 2 puffs into the lungs every 6 (six) hours as needed for wheezing or shortness of breath.    ALPRAZolam (XANAX) 1 MG tablet Take 1 mg by mouth 2 (two) times daily as needed for anxiety.     carvedilol (COREG) 25 MG tablet Take 25 mg by mouth 2 (two) times daily.   Associated Diagnoses: Other specified forms of chronic ischemic heart disease; Automatic implantable cardiac defibrillator in situ; Tobacco use disorder    colchicine 0.6 MG tablet Take 0.6 mg by mouth daily as needed (gout).    cyclobenzaprine (FLEXERIL) 10 MG tablet Take 10 mg by mouth 3 (three) times daily as needed for muscle spasms.    Associated Diagnoses: Other specified forms of chronic ischemic heart disease; Automatic implantable cardiac defibrillator in situ; Tobacco use disorder    ferrous fumarate (HEMOCYTE - 106 MG FE)  325 (106 FE) MG TABS tablet Take 1 tablet by mouth daily.    Fluticasone-Salmeterol (ADVAIR) 100-50 MCG/DOSE AEPB Inhale 1 puff into the lungs 2 (two) times daily.    gabapentin (NEURONTIN) 400 MG capsule Take 800 mg by mouth 2 (two) times daily.    glipiZIDE (GLUCOTROL XL) 10 MG 24 hr tablet Take 20 mg by mouth daily with breakfast.    Associated Diagnoses: Other specified forms of chronic ischemic heart disease; Automatic implantable cardiac defibrillator in situ; Tobacco use disorder    HYDROmorphone (DILAUDID) 4 MG tablet Take 4 mg by mouth every 6 (six) hours as needed for moderate pain.    Associated Diagnoses: Other specified forms of chronic ischemic heart disease; Automatic implantable cardiac defibrillator in situ; Tobacco use disorder    metFORMIN (GLUCOPHAGE-XR) 500 MG 24 hr tablet Take 500 mg by mouth 4 (four) times daily.    Associated Diagnoses: Other specified forms of chronic ischemic heart disease; Automatic implantable cardiac defibrillator in situ; Tobacco use disorder    promethazine (PHENERGAN) 25 MG tablet Take 12.5 mg by mouth every 6 (six) hours as needed for nausea or vomiting. Take with Dilaudid.    ramipril (ALTACE) 10 MG capsule Take 10 mg by mouth daily.  Associated Diagnoses: Other specified forms of chronic ischemic heart disease; Automatic implantable cardiac defibrillator in situ; Tobacco use disorder    tiotropium (SPIRIVA) 18 MCG inhalation capsule Place 18 mcg into inhaler and inhale daily.    ZETIA 10 MG tablet Take 10 mg by mouth daily.    Associated Diagnoses: Other specified forms of chronic ischemic heart disease; Automatic implantable cardiac defibrillator in situ; Tobacco use disorder      STOP taking these medications     aspirin 81 MG tablet        Allergies  Allergen Reactions  . Statins Other (See Comments)    unknown   Follow-up Information    Follow up with Estanislado Pandy, MD On 03/24/2015.   Specialty:  Family Medicine   Why:   for evaluation of anemia, possible referral to ENT, diabtes control at 11:15 am   Contact information:   [address] [place] [address] [phone]        The results of significant diagnostics from this hospitalization (including imaging, microbiology, ancillary and laboratory) are listed below for reference.    Significant Diagnostic Studies: Ct Chest W Contrast  03/09/2015   CLINICAL DATA:  Initial evaluation for hemoptysis.  EXAM: CT CHEST WITH CONTRAST  TECHNIQUE: Multidetector CT imaging of the chest was performed during intravenous contrast administration.  CONTRAST:  OMNIPAQUE IOHEXOL 300 MG/ML  SOLN  COMPARISON:  Prior radiograph performed earlier on the same day.  FINDINGS: Thyroid gland within normal limits.  1.6 cm pretracheal node is present (series 2, image 11). Adjacent enlarged node measuring approximately 1.7 cm in short axis also present (series 2, image 14). Distally, there is an a large right pretracheal node measuring 2.2 cm in short axis (series 2, image 25). Right hilar adenopathy measures up to 1.9 cm. Central hypodensity within these nodes suggest necrosis. No left hilar or axillary adenopathy. There is an enlarged right supraclavicular node measuring 1.8 cm in short axis (series 2, image 2).  Intrathoracic aorta of normal caliber. Scattered atheromatous plaque present within the aortic arch in at the origin of the great vessels. No aneurysm.  Heart size within normal limits. Left-sided pacemaker noted. Scattered 3 vessel coronary artery calcifications present. No pericardial effusion.  There is patchy and irregular ground-glass opacities throughout the right upper lobe with involvement of the right middle lobe as well. Findings are suspicious for possible multi lobar pneumonia. There are is a superimposed 8 mm nodule within the right middle lobe (series 3, image 39). Right lower lobe remains relatively spared. The left lung is clear. Scattered emphysematous changes noted.  No pulmonary edema or pleural effusion.  2.5 cm cyst partially visualized within the right kidney. Remainder the visualized upper abdomen within normal limits. Prominent atheromatous disease present within the intra-abdominal aorta and its branch vessels.  No acute osseous abnormality. No worrisome lytic or blastic osseous lesions.  IMPRESSION: 1. Irregular patchy and ground-glass opacities involving the right upper and middle lobes, suspicious for multi lobar pneumonia. Radiographic followup to resolution is recommended. 2. Enlarged mediastinal, right hilar, and right supraclavicular adenopathy as above, likely reactive in nature. 3. Three vessel coronary artery calcifications with prominent atheromatous plaque within the visualized aorta. No aneurysm. 4. Emphysema.   Electronically Signed   By: Rise Mu M.D.   On: 03/09/2015 04:39   Dg Chest Portable 1 View  03/09/2015   CLINICAL DATA:  Vomiting and hemoptysis for 4 days.  Dyspnea today.  EXAM: PORTABLE CHEST - 1 VIEW  COMPARISON:  06/24/2010  FINDINGS: There is right upper lobe infiltrate. Question right hilar adenopathy. Left lung is clear. There are no large effusions.  IMPRESSION: Right upper lobe infiltrate, possibly pneumonia. If there is clinical evidence of pneumonia, follow-up PA and lateral chest X-ray is recommended in 3-4 weeks following trial of antibiotic therapy to ensure resolution and exclude underlying malignancy. If the patient does not have clinical pneumonia, recommend chest CT with contrast to exclude malignancy.   Electronically Signed   By: Ellery Plunkaniel R Mitchell M.D.   On: 03/09/2015 02:04    Microbiology: Recent Results (from the past 240 hour(s))  Culture, blood (routine x 2)     Status: None (Preliminary result)   Collection Time: 03/09/15  4:36 AM  Result Value Ref Range Status   Specimen Description RIGHT ANTECUBITAL  Final   Special Requests BOTTLES DRAWN AEROBIC AND ANAEROBIC 6CC  Final   Culture NO GROWTH 1 DAY   Final   Report Status PENDING  Incomplete  Culture, blood (routine x 2)     Status: None (Preliminary result)   Collection Time: 03/09/15  4:40 AM  Result Value Ref Range Status   Specimen Description BLOOD RIGHT HAND  Final   Special Requests BOTTLES DRAWN AEROBIC AND ANAEROBIC 6CC  Final   Culture NO GROWTH 1 DAY  Final   Report Status PENDING  Incomplete     Labs: Basic Metabolic Panel:  Recent Labs Lab 03/09/15 0135 03/09/15 0436 03/10/15 0607  NA 132* 137 138  K 4.6 4.2 4.3  CL 99* 105 106  CO2 23 23 27   GLUCOSE 214* 87 211*  BUN 33* 31* 19  CREATININE 1.38* 1.20 1.21  CALCIUM 8.7* 8.6* 8.5*   Liver Function Tests:  Recent Labs Lab 03/09/15 0135 03/09/15 0436  AST 14* 14*  ALT 14* 12*  ALKPHOS 43 39  BILITOT 0.4 0.4  PROT 6.0* 5.6*  ALBUMIN 3.2* 3.0*   No results for input(s): LIPASE, AMYLASE in the last 168 hours. No results for input(s): AMMONIA in the last 168 hours. CBC:  Recent Labs Lab 03/09/15 0135 03/09/15 0436 03/09/15 1950 03/10/15 0607  WBC 13.3* 9.4 7.4 6.6  NEUTROABS 10.5*  --   --   --   HGB 8.2* 7.5* 7.8* 7.3*  HCT 25.1* 22.7* 23.9* 22.5*  MCV 78.7 78.5 80.2 80.9  PLT 240 190 164 165   Cardiac Enzymes: No results for input(s): CKTOTAL, CKMB, CKMBINDEX, TROPONINI in the last 168 hours. BNP: BNP (last 3 results) No results for input(s): BNP in the last 8760 hours.  ProBNP (last 3 results) No results for input(s): PROBNP in the last 8760 hours.  CBG:  Recent Labs Lab 03/09/15 1611 03/09/15 1707 03/09/15 2241 03/10/15 0744 03/10/15 1124  GLUCAP 100* 118* 269* 215* 176*       Signed:  Esiah Bazinet M  [facility] 03/10/2015, 1:19 PM

## 2015-03-10 NOTE — Progress Notes (Signed)
Subjective: No overt signs of GI bleeding. No hemoptysis. Tolerating diet. No dysphagia.   Objective: Vital signs in last 24 hours: Temp:  [98.4 F (36.9 C)-98.6 F (37 C)] 98.6 F (37 C) (05/10 0546) Pulse Rate:  [56-74] 74 (05/10 0546) Resp:  [13-23] 20 (05/10 0546) BP: (108-145)/(29-58) 118/55 mmHg (05/10 0546) SpO2:  [92 %-99 %] 92 % (05/10 0711) Weight:  [168 lb 10.4 oz (76.5 kg)] 168 lb 10.4 oz (76.5 kg) (05/10 0546) Last BM Date: 03/08/15 General:   Alert and oriented, pleasant Head:  Normocephalic and atraumatic. Eyes:  No icterus, sclera clear. Conjuctiva pink.  Abdomen:  Bowel sounds present, soft, non-tender, non-distended. Umbilical hernia Extremities:  Without edema. Neurologic:  Alert and  oriented x4;  grossly normal neurologically. Psych:  Alert and cooperative. Normal mood and affect.  Intake/Output from previous day: 05/09 0701 - 05/10 0700 In: 340 [P.O.:240; I.V.:100] Out: 300 [Urine:300] Intake/Output this shift:    Lab Results:  Recent Labs  03/09/15 0436 03/09/15 1950 03/10/15 0607  WBC 9.4 7.4 6.6  HGB 7.5* 7.8* 7.3*  HCT 22.7* 23.9* 22.5*  PLT 190 164 165   BMET  Recent Labs  03/09/15 0135 03/09/15 0436 03/10/15 0607  NA 132* 137 138  K 4.6 4.2 4.3  CL 99* 105 106  CO2 23 23 27   GLUCOSE 214* 87 211*  BUN 33* 31* 19  CREATININE 1.38* 1.20 1.21  CALCIUM 8.7* 8.6* 8.5*   LFT  Recent Labs  03/09/15 0135 03/09/15 0436  PROT 6.0* 5.6*  ALBUMIN 3.2* 3.0*  AST 14* 14*  ALT 14* 12*  ALKPHOS 43 39  BILITOT 0.4 0.4   PT/INR  Recent Labs  03/09/15 0135  LABPROT 13.8  INR 1.05    Studies/Results: Ct Chest W Contrast  03/09/2015   CLINICAL DATA:  Initial evaluation for hemoptysis.  EXAM: CT CHEST WITH CONTRAST  TECHNIQUE: Multidetector CT imaging of the chest was performed during intravenous contrast administration.  CONTRAST:  100mL OMNIPAQUE IOHEXOL 300 MG/ML  SOLN  COMPARISON:  Prior radiograph performed earlier on  the same day.  FINDINGS: Thyroid gland within normal limits.  1.6 cm pretracheal node is present (series 2, image 11). Adjacent enlarged node measuring approximately 1.7 cm in short axis also present (series 2, image 14). Distally, there is an a large right pretracheal node measuring 2.2 cm in short axis (series 2, image 25). Right hilar adenopathy measures up to 1.9 cm. Central hypodensity within these nodes suggest necrosis. No left hilar or axillary adenopathy. There is an enlarged right supraclavicular node measuring 1.8 cm in short axis (series 2, image 2).  Intrathoracic aorta of normal caliber. Scattered atheromatous plaque present within the aortic arch in at the origin of the great vessels. No aneurysm.  Heart size within normal limits. Left-sided pacemaker noted. Scattered 3 vessel coronary artery calcifications present. No pericardial effusion.  There is patchy and irregular ground-glass opacities throughout the right upper lobe with involvement of the right middle lobe as well. Findings are suspicious for possible multi lobar pneumonia. There are is a superimposed 8 mm nodule within the right middle lobe (series 3, image 39). Right lower lobe remains relatively spared. The left lung is clear. Scattered emphysematous changes noted. No pulmonary edema or pleural effusion.  2.5 cm cyst partially visualized within the right kidney. Remainder the visualized upper abdomen within normal limits. Prominent atheromatous disease present within the intra-abdominal aorta and its branch vessels.  No acute osseous abnormality. No worrisome  lytic or blastic osseous lesions.  IMPRESSION: 1. Irregular patchy and ground-glass opacities involving the right upper and middle lobes, suspicious for multi lobar pneumonia. Radiographic followup to resolution is recommended. 2. Enlarged mediastinal, right hilar, and right supraclavicular adenopathy as above, likely reactive in nature. 3. Three vessel coronary artery calcifications  with prominent atheromatous plaque within the visualized aorta. No aneurysm. 4. Emphysema.   Electronically Signed   By: Rise MuBenjamin  McClintock M.D.   On: 03/09/2015 04:39   Dg Chest Portable 1 View  03/09/2015   CLINICAL DATA:  Vomiting and hemoptysis for 4 days.  Dyspnea today.  EXAM: PORTABLE CHEST - 1 VIEW  COMPARISON:  06/24/2010  FINDINGS: There is right upper lobe infiltrate. Question right hilar adenopathy. Left lung is clear. There are no large effusions.  IMPRESSION: Right upper lobe infiltrate, possibly pneumonia. If there is clinical evidence of pneumonia, follow-up PA and lateral chest X-ray is recommended in 3-4 weeks following trial of antibiotic therapy to ensure resolution and exclude underlying malignancy. If the patient does not have clinical pneumonia, recommend chest CT with contrast to exclude malignancy.   Electronically Signed   By: Ellery Plunkaniel R Mitchell M.D.   On: 03/09/2015 02:04    Assessment: 64 year old male with possible hematemesis and recurrent esophageal dysphagia, s/p EGD 03/09/15 with Schatzki's ring s/p dilation, small hiatal hernia, otherwise normal EGD. Consier pulmonary/ENT as source for overt bleeding as described per patient. Needs outpatient routine screening colonoscopy.   Anemia: iron, TIBC, and ferritin pending. Hgb 7.3 this morning. No overt GI bleeding.   Hemoptysis: managed per hospitalist. None further. CT shows multilobar pneumonia.   Plan: Will follow-up on iron, ferritin, TIBC PPI BID  Needs screening colonoscopy as outpatient Stable from a GI standpoint. Signing off.   Nira RetortAnna W. Sams, ANP-BC [facility]    LOS: 1 day    03/10/2015, 7:56 AM

## 2015-03-11 LAB — LEGIONELLA ANTIGEN, URINE

## 2015-03-13 LAB — TYPE AND SCREEN
ABO/RH(D): A POS
ANTIBODY SCREEN: NEGATIVE
Unit division: 0
Unit division: 0

## 2015-03-14 LAB — CULTURE, BLOOD (ROUTINE X 2)
Culture: NO GROWTH
Culture: NO GROWTH

## 2015-03-20 ENCOUNTER — Ambulatory Visit: Payer: Medicare Other | Admitting: Internal Medicine

## 2015-04-01 ENCOUNTER — Ambulatory Visit: Payer: Medicare Other | Admitting: Gastroenterology

## 2015-04-01 ENCOUNTER — Telehealth: Payer: Self-pay | Admitting: Gastroenterology

## 2015-04-01 ENCOUNTER — Telehealth: Payer: Self-pay | Admitting: *Deleted

## 2015-04-01 NOTE — Telephone Encounter (Signed)
Please send letter to follow-up with us. Needs hospital follow-up.

## 2015-04-01 NOTE — Telephone Encounter (Signed)
Pt was a no show

## 2015-04-02 ENCOUNTER — Encounter: Payer: Self-pay | Admitting: Gastroenterology

## 2015-04-02 NOTE — Telephone Encounter (Signed)
Error

## 2015-04-02 NOTE — Telephone Encounter (Signed)
MAILED LETTER °

## 2015-04-06 ENCOUNTER — Encounter: Payer: Self-pay | Admitting: Internal Medicine

## 2015-05-01 DEATH — deceased

## 2015-05-18 ENCOUNTER — Encounter: Payer: Self-pay | Admitting: Internal Medicine

## 2015-05-18 ENCOUNTER — Ambulatory Visit: Payer: Medicare Other | Admitting: Internal Medicine

## 2016-10-15 IMAGING — CT CT CHEST W/ CM
2 of 3 series · 15 of 36 positions shown, 18 images · IV contrast (Omnipaque 300)
Comparison: Prior radiograph performed earlier on the same day.

CLINICAL DATA: Initial evaluation for hemoptysis.

EXAM:
CT CHEST WITH CONTRAST
TECHNIQUE: Multidetector CT imaging of the chest was performed during
intravenous contrast administration.
CONTRAST:  100mL OMNIPAQUE IOHEXOL 300 MG/ML  SOLN

[Series 2: chestroutine 5.0 b40f · axial · 0.75mm/px · z∈[-340,-50]mm · 12 of 70 slices shown, 15 images]
[im 6/70  mediastinal]
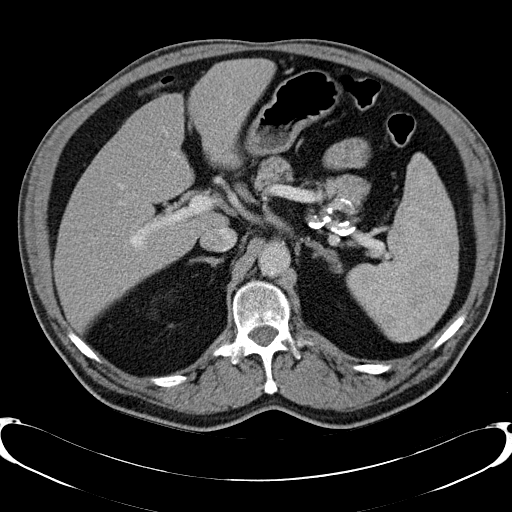
[im 6/70  lung]
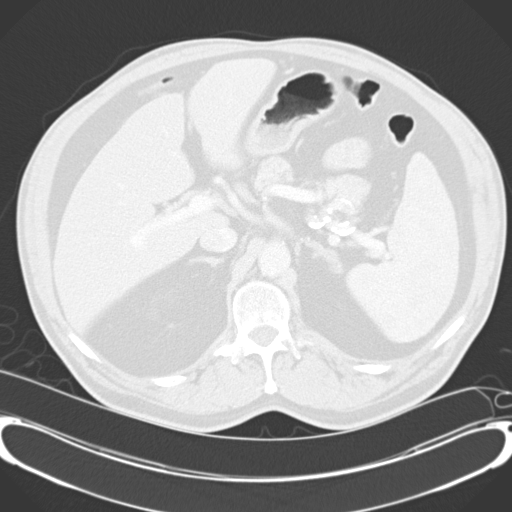
[im 11/70  lung]
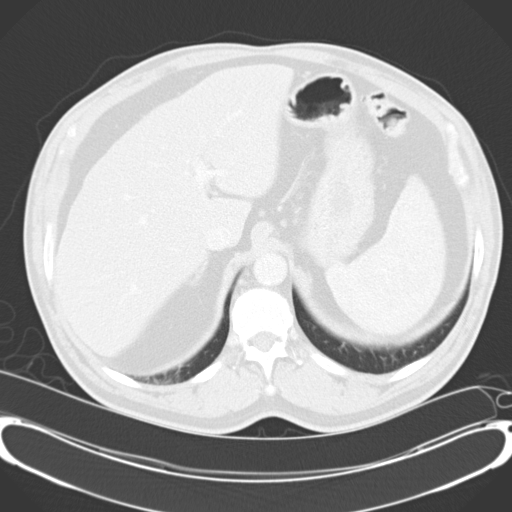
[im 16/70  lung]
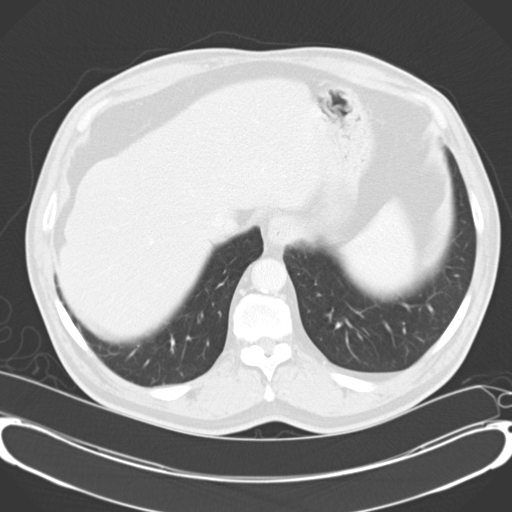
[im 21/70  lung]
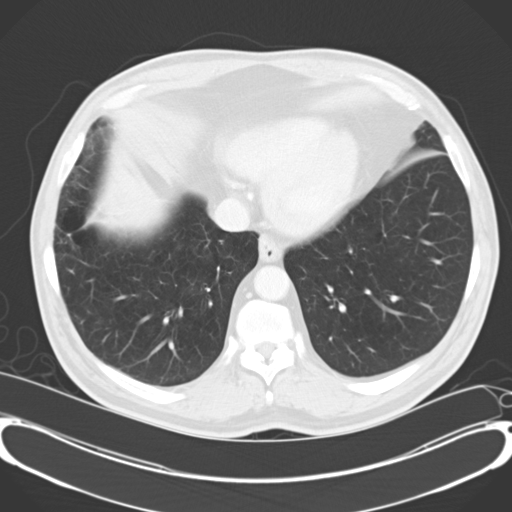
[im 26/70  mediastinal]
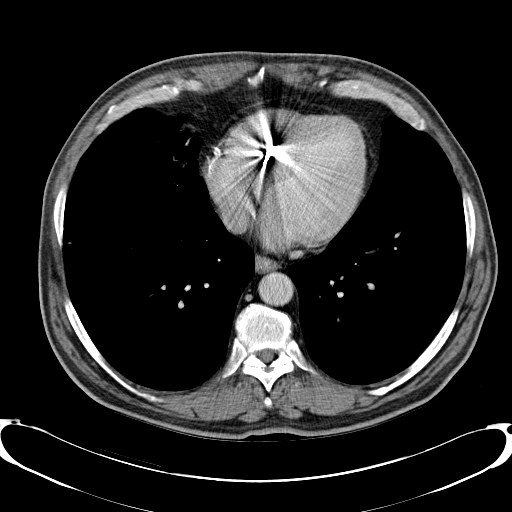
[im 26/70  lung]
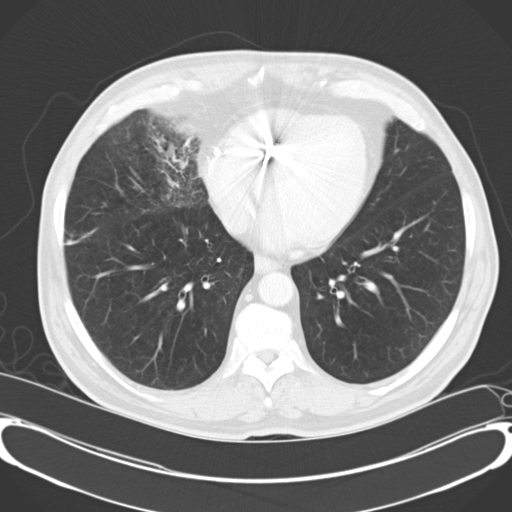
[im 31/70  lung]
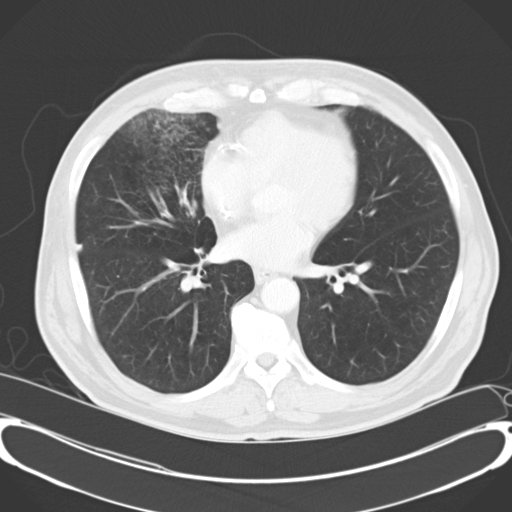
[im 39/70  lung]
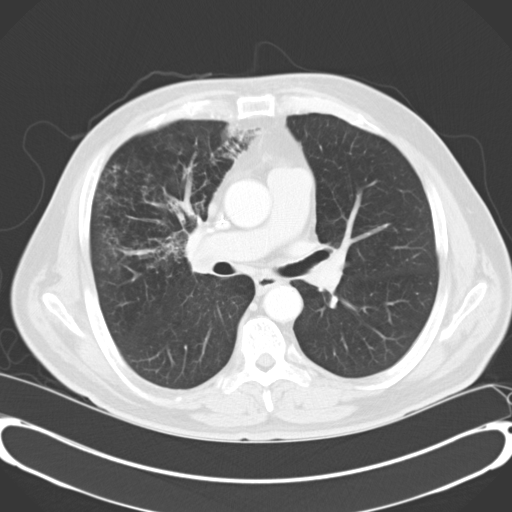
[im 44/70  lung]
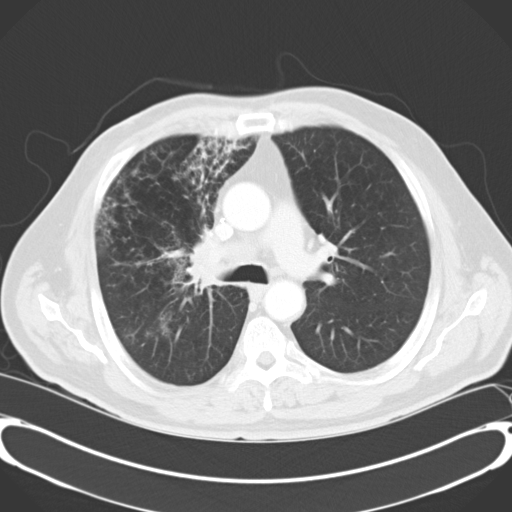
[im 49/70  mediastinal]
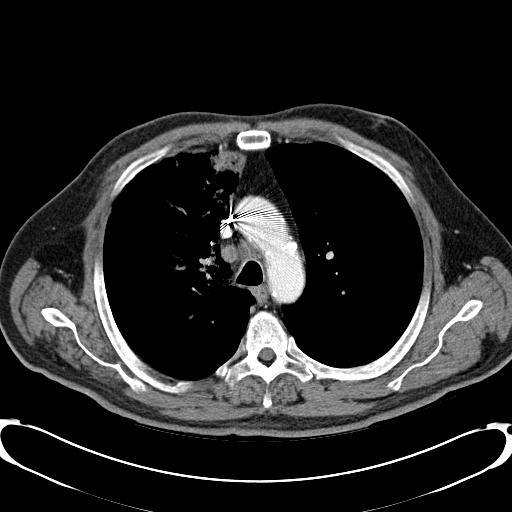
[im 49/70  lung]
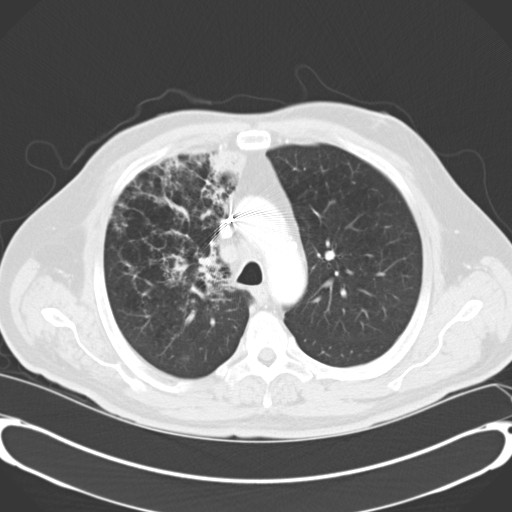
[im 54/70  lung]
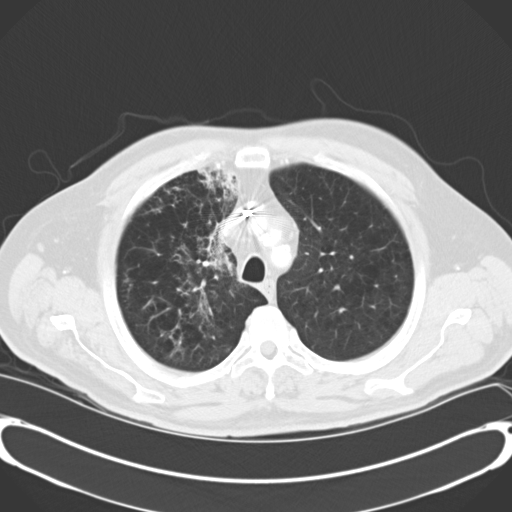
[im 59/70  lung]
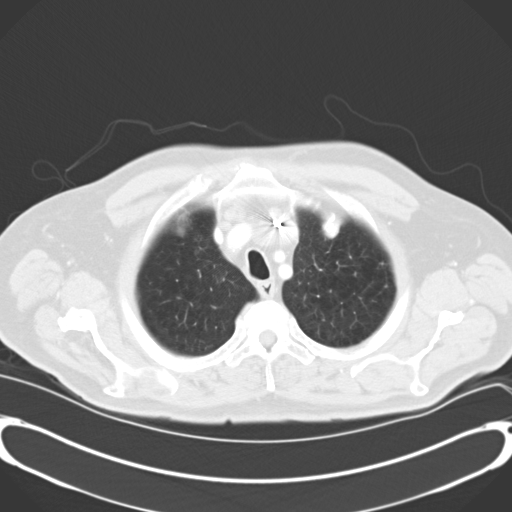
[im 64/70  lung]
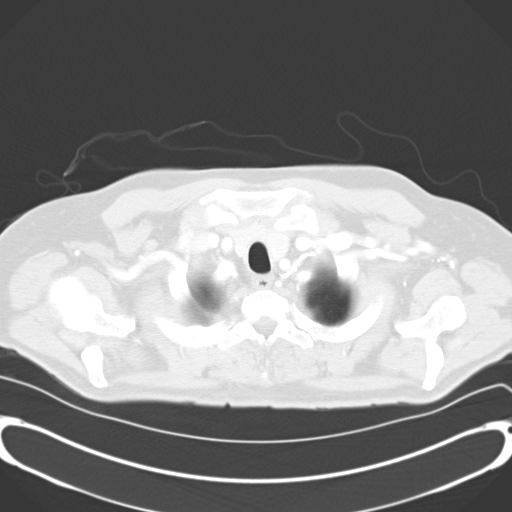

[Series 4: mpr coronal chest 3mm · coronal · 0.68mm/px · 3 of 98 slices shown]
[im 20/98  lung]
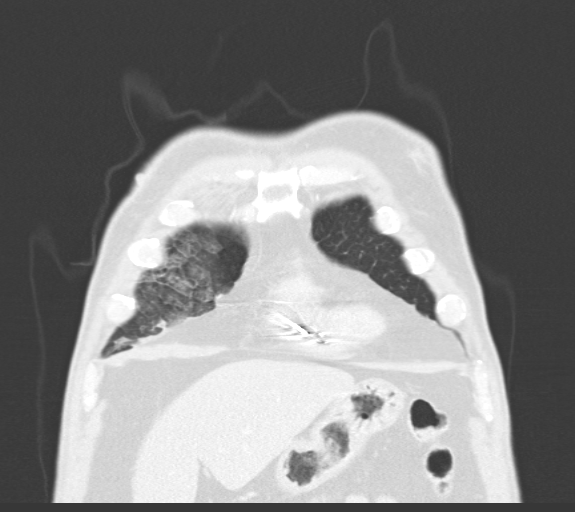
[im 39/98  lung]
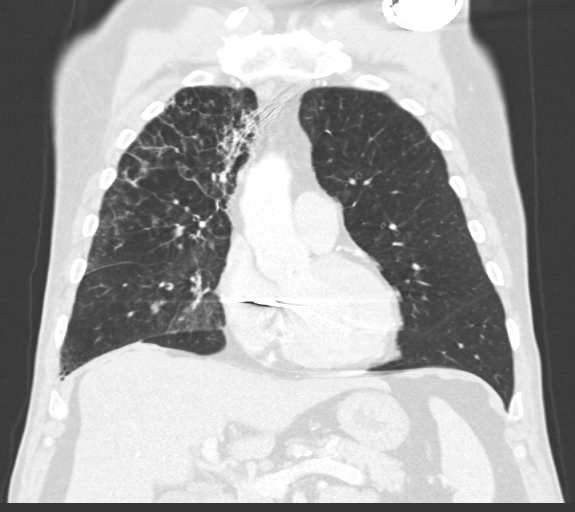
[im 59/98  lung]
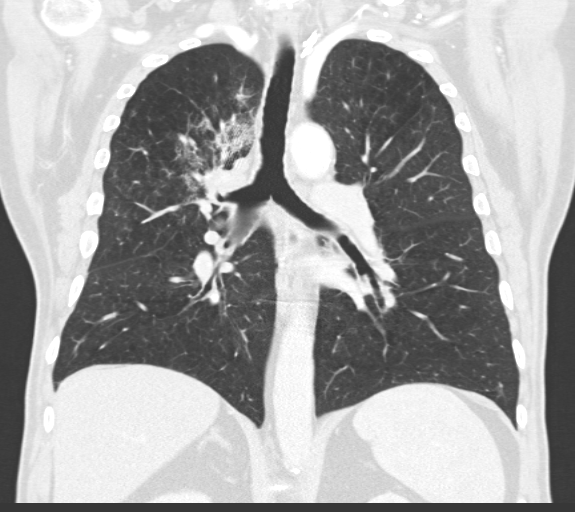

[15 of 36 positions shown; findings below may reference images not displayed]

FINDINGS: Thyroid gland within normal limits.

1.6 cm pretracheal node is present (series 2, image 11). Adjacent
enlarged node measuring approximately 1.7 cm in short axis also
present (series 2, image 14). Distally, there is an a large right
pretracheal node measuring 2.2 cm in short axis (series 2, image
25). Right hilar adenopathy measures up to 1.9 cm. Central
hypodensity within these nodes suggest necrosis. No left hilar or
axillary adenopathy. There is an enlarged right supraclavicular node
measuring 1.8 cm in short axis (series 2, image 2).

Intrathoracic aorta of normal caliber. Scattered atheromatous plaque
present within the aortic arch in at the origin of the great
vessels. No aneurysm.

Heart size within normal limits. Left-sided pacemaker noted.
Scattered 3 vessel coronary artery calcifications present. No
pericardial effusion.

There is patchy and irregular ground-glass opacities throughout the
right upper lobe with involvement of the right middle lobe as well.
Findings are suspicious for possible multi lobar pneumonia. There
are is a superimposed 8 mm nodule within the right middle lobe
(series 3, image 39). Right lower lobe remains relatively spared.
The left lung is clear. Scattered emphysematous changes noted. No
pulmonary edema or pleural effusion.

2.5 cm cyst partially visualized within the right kidney. Remainder
the visualized upper abdomen within normal limits. Prominent
atheromatous disease present within the intra-abdominal aorta and
its branch vessels.

No acute osseous abnormality. No worrisome lytic or blastic osseous
lesions.
IMPRESSION: 1. Irregular patchy and ground-glass opacities involving the right
upper and middle lobes, suspicious for multi lobar pneumonia.
Radiographic followup to resolution is recommended.
2. Enlarged mediastinal, right hilar, and right supraclavicular
adenopathy as above, likely reactive in nature.
3. Three vessel coronary artery calcifications with prominent
atheromatous plaque within the visualized aorta. No aneurysm.
4. Emphysema.
